# Patient Record
Sex: Male | Born: 1970 | Race: White | Hispanic: No | Marital: Married | Smoking: Never smoker
Health system: Southern US, Community
[De-identification: ages and names within clinical notes are randomized; demographics above are authoritative.]

## PROBLEM LIST (undated history)

## (undated) DIAGNOSIS — F419 Anxiety disorder, unspecified: Secondary | ICD-10-CM

## (undated) DIAGNOSIS — K219 Gastro-esophageal reflux disease without esophagitis: Secondary | ICD-10-CM

## (undated) HISTORY — PX: CLEFT PALATE REPAIR: SUR1165

---

## 1995-04-12 HISTORY — PX: CHOLESTEATOMA EXCISION: SHX1345

## 1998-04-11 HISTORY — PX: MENISCUS REPAIR: SHX5179

## 2008-01-22 ENCOUNTER — Ambulatory Visit (HOSPITAL_BASED_OUTPATIENT_CLINIC_OR_DEPARTMENT_OTHER): Admission: RE | Admit: 2008-01-22 | Discharge: 2008-01-22 | Payer: Self-pay | Admitting: Orthopaedic Surgery

## 2008-11-07 ENCOUNTER — Emergency Department (HOSPITAL_COMMUNITY): Admission: EM | Admit: 2008-11-07 | Discharge: 2008-11-07 | Payer: Self-pay | Admitting: Emergency Medicine

## 2009-04-13 ENCOUNTER — Emergency Department (HOSPITAL_COMMUNITY): Admission: EM | Admit: 2009-04-13 | Discharge: 2009-04-14 | Payer: Self-pay | Admitting: Emergency Medicine

## 2009-09-18 ENCOUNTER — Encounter: Admission: RE | Admit: 2009-09-18 | Discharge: 2009-09-18 | Payer: Self-pay | Admitting: Internal Medicine

## 2009-09-29 ENCOUNTER — Encounter: Admission: RE | Admit: 2009-09-29 | Discharge: 2009-09-29 | Payer: Self-pay | Admitting: Internal Medicine

## 2010-06-27 LAB — BASIC METABOLIC PANEL
CO2: 26 mEq/L (ref 19–32)
Calcium: 9.7 mg/dL (ref 8.4–10.5)
Creatinine, Ser: 0.91 mg/dL (ref 0.4–1.5)
GFR calc Af Amer: >60 mL/min (ref >60)
GFR calc non Af Amer: >60 mL/min (ref >60)
Potassium: 3.6 mEq/L (ref 3.5–5.1)

## 2010-06-27 LAB — DIFFERENTIAL
Basophils Relative: 1 % (ref 0–1)
Eosinophils Absolute: 0.1 10*3/uL (ref 0.0–0.7)
Eosinophils Relative: 1 % (ref 0–5)
Lymphs Abs: 2.9 10*3/uL (ref 0.7–4.0)
Monocytes Absolute: 0.5 10*3/uL (ref 0.1–1.0)
Neutro Abs: 4 10*3/uL (ref 1.7–7.7)

## 2010-06-27 LAB — POCT CARDIAC MARKERS
CKMB, poc: 1.5 ng/mL (ref 1.0–8.0)
CKMB, poc: 2.6 ng/mL (ref 1.0–8.0)
Myoglobin, poc: 76.5 ng/mL (ref 12–200)

## 2010-06-27 LAB — CBC
Platelets: 221 10*3/uL (ref 150–400)
RBC: 5.46 MIL/uL (ref 4.22–5.81)
WBC: 7.5 10*3/uL (ref 4.0–10.5)

## 2010-07-18 LAB — DIFFERENTIAL
Eosinophils Relative: 3 % (ref 0–5)
Lymphocytes Relative: 33 % (ref 12–46)
Lymphs Abs: 2.7 10*3/uL (ref 0.7–4.0)
Monocytes Relative: 7 % (ref 3–12)
Neutrophils Relative %: 56 % (ref 43–77)

## 2010-07-18 LAB — URINALYSIS, ROUTINE W REFLEX MICROSCOPIC
Bilirubin Urine: NEGATIVE
Nitrite: NEGATIVE
Specific Gravity, Urine: 1.027 (ref 1.005–1.030)
Urobilinogen, UA: 0.2 mg/dL (ref 0.0–1.0)
pH: 6 (ref 5.0–8.0)

## 2010-07-18 LAB — CBC
HCT: 45.2 % (ref 39.0–52.0)
Hemoglobin: 15.9 g/dL (ref 13.0–17.0)
MCHC: 35 g/dL (ref 30.0–36.0)
MCV: 86.2 fL (ref 78.0–100.0)
WBC: 8.1 10*3/uL (ref 4.0–10.5)

## 2010-07-18 LAB — BASIC METABOLIC PANEL
BUN: 22 mg/dL (ref 6–23)
CO2: 20 mEq/L (ref 19–32)
Creatinine, Ser: 1.09 mg/dL (ref 0.4–1.5)
GFR calc Af Amer: >60 mL/min (ref >60)
Glucose, Bld: 138 mg/dL — ABNORMAL HIGH (ref 70–99)
Sodium: 134 mEq/L — ABNORMAL LOW (ref 135–145)

## 2010-07-18 LAB — URINE MICROSCOPIC-ADD ON

## 2010-08-24 NOTE — Op Note (Signed)
NAME:  Mark Berg, Mark Berg                 ACCOUNT NO.:  [id number]   MEDICAL RECORD NO.:  [id number]          PATIENT TYPE:  AMB   LOCATION:  DSC                          FACILITY:  [facility]   PHYSICIAN:  Vanita Panda. Magnus Ivan, M.D.DATE OF BIRTH:  [birth year]   DATE OF PROCEDURE:  01/22/2008  DATE OF DISCHARGE:                               OPERATIVE REPORT   PREOPERATIVE DIAGNOSIS:  Right knee medial meniscal tear.   POSTOPERATIVE DIAGNOSIS:  Right knee medial meniscal tear.   PROCEDURE:  1. Right knee arthroscopy with debridement.  2. Right knee arthroscopy with partial medial meniscectomy.   SURGEON:  Vanita Panda. Magnus Ivan, MD   ANESTHESIA:  1. Right local knee block.  2. General anesthesia via LMA.   TOURNIQUET TIME:  None.   BLOOD LOSS:  Minimal.   COMPLICATIONS:  None.   INDICATIONS:  Briefly, Mr. Favia is a 40 year old gentleman who has  been having pain in his right knee for several months now.  He is an  avid runner.  He would like to get more active.  The knee has kept  bothering him on the medial side, so an MRI was obtained and showed a  complex tear involving the mid body to the posterior horn of the medial  meniscus.  I recommended he undergo arthroscopy with a partial medial  meniscectomy.  The risks and benefits of the surgery were explained to  him in length and he agreed to proceed with the surgery.   PROCEDURE IN DETAIL:  After informed consent was obtained and the  appropriate right knee was marked, anesthesia was obtained in local knee  block.  He was brought to the operating room, placed supine on the  operating table.  Nonsterile tourniquet was placed on his upper right  thigh, was never utilized during the case.  A lateral leg post was  utilized and the right leg was prepped and draped with DuraPrep and  sterile drapes including sterile stockinette.  Time-out was called to  identify the correct patient and correct right knee.  With the bed  raised and  leg flexed off side table, I made an anterolateral portal  just lateral to the patellar tendon level inferior pole patella and the  arthroscope was inserted right to the medial side and we could see there  was a complex tear of the medial meniscus.  The ACL was intact and the  intercondylar area as well as the PCL and with the knee in the figure-of-  four position, the lateral compartment was intact.  I then attempted to  make a medial portal site.  The patient did seem to be reacting to pain,  so anesthesia placed LMA for general anesthesia.  I then proceeded with  the remaining portion of the case.  Anterior medial portal was then made  and proceeded to carry out a partial medial meniscectomy.  Using up  cutting biters an arthroscopic shaver, I was able to perform partial  medial mass meniscectomy  leaving some remaining meniscal tissue in the  posterior aspect.  The cartilage was probed on the femoral  condyle and  the plateau.  It was stable in the remaining cartilage and structures  within the knee were also stable.  I then removed the arthroscope and  the instrumentation, removed the effusion from the knee.  I injected a  mixture of Marcaine and morphine into the knee as well as the portal  sites and closed the portal sites with interrupted more 4-0 nylon.  Xeroform followed up with sterile dressing was applied.  The patient was  awakened, extubated, and taken to the recovery room in stable condition.  Postoperatively, I will let him weightbear as tolerated.  He is going to  be at work for the next week and to follow up in the office.      Vanita Panda. Magnus Ivan, M.D.  Electronically Signed     CYB/MEDQ  D:  01/22/2008  T:  01/23/2008  Job:  [id number]

## 2011-01-02 IMAGING — CT CT ABD-PELV W/ CM
2 of 4 series · 10 of 36 positions shown, 17 images · IV contrast (READICAT/WATER & [ID] OMNI 300)
Comparison: Ultrasound of 09/18/2009 and CT abdomen pelvis of
11/07/2008

CLINICAL DATA: Bilateral inguinal lymph nodes noted on ultrasound

CT ABDOMEN AND PELVIS WITH CONTRAST
TECHNIQUE: Multidetector CT imaging of the abdomen and pelvis was
performed following the standard protocol during bolus
administration of intravenous contrast.
Contrast: 100 ml [id number]

[Series 3: routine abdomen · axial · 0.74mm/px · z∈[-357,+3]mm · 9 of 91 slices shown, 15 images]
[im 10/91  soft-tissue]
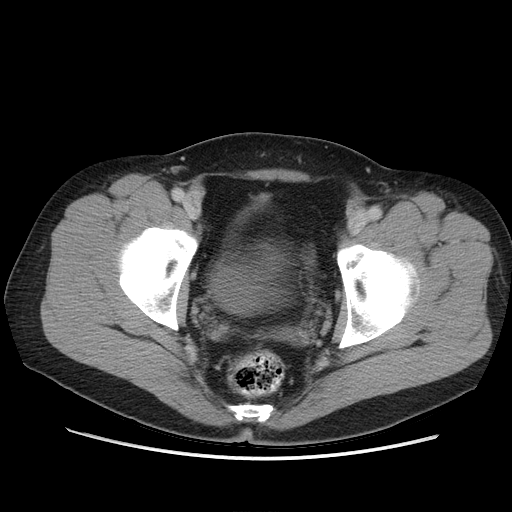
[im 10/91  bone]
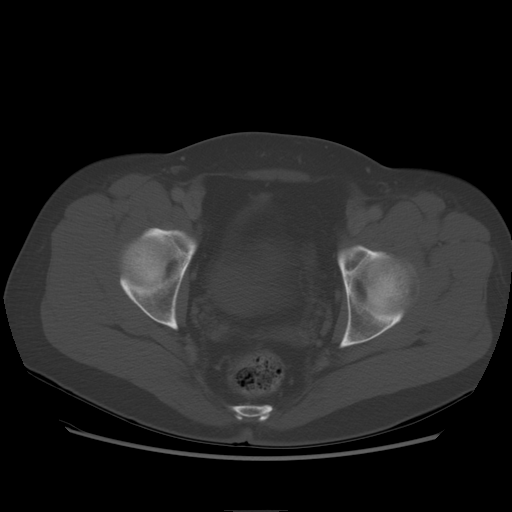
[im 19/91  soft-tissue]
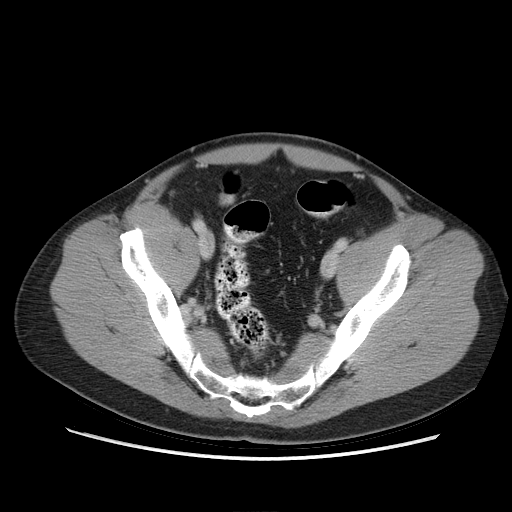
[im 28/91  soft-tissue]
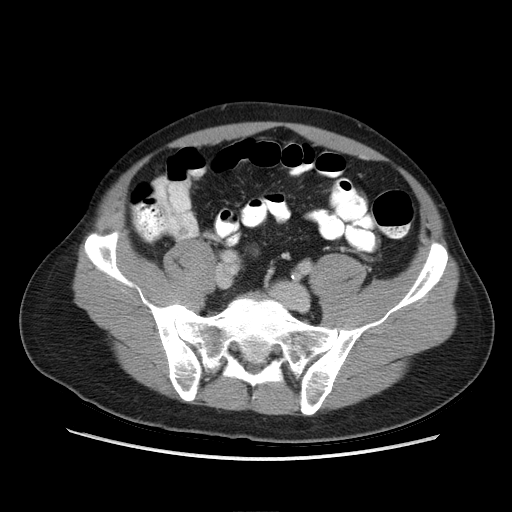
[im 37/91  soft-tissue]
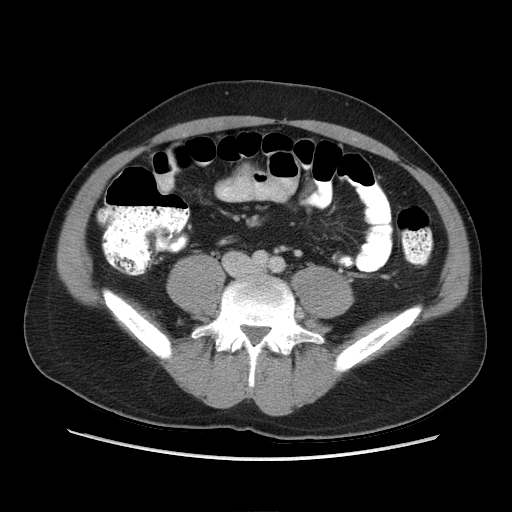
[im 46/91  soft-tissue]
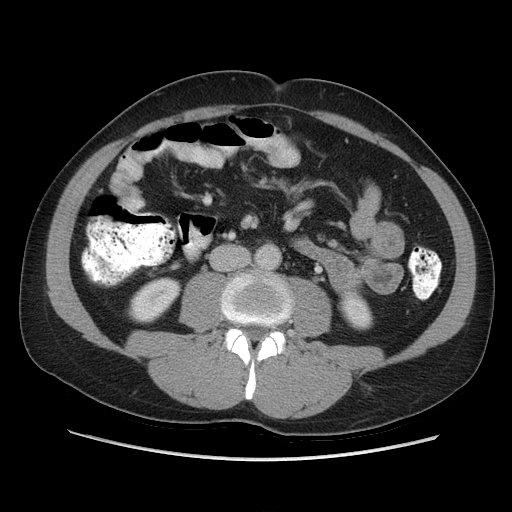
[im 55/91  soft-tissue]
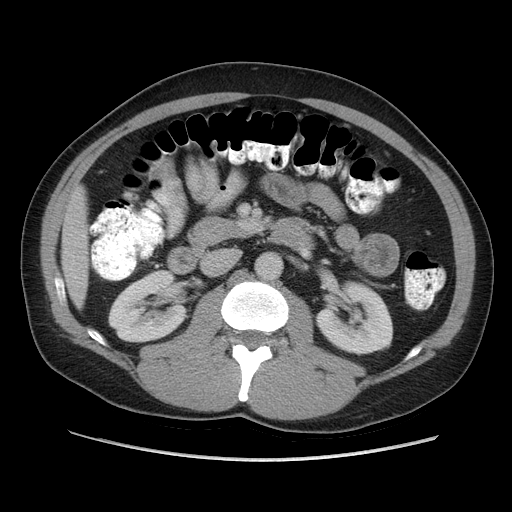
[im 55/91  lung]
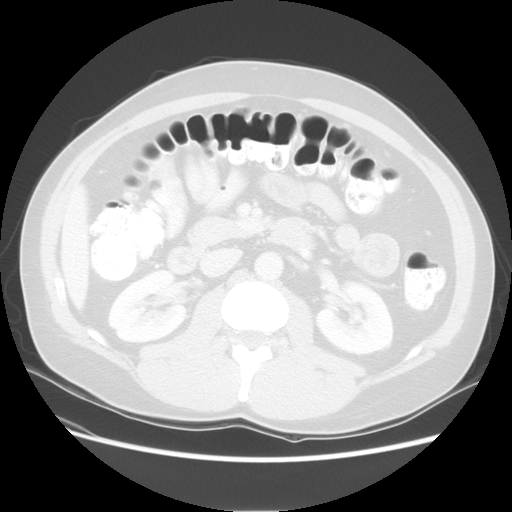
[im 64/91  soft-tissue]
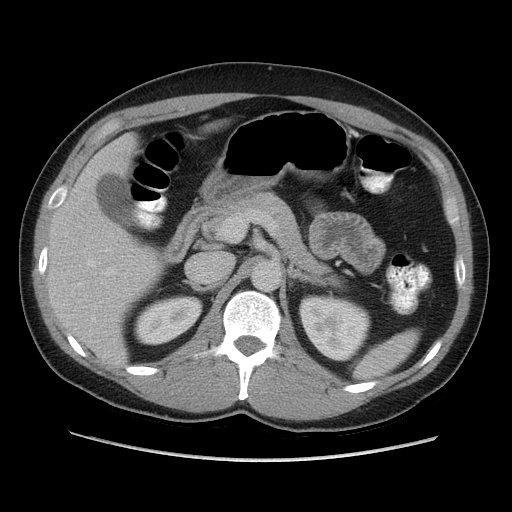
[im 64/91  lung]
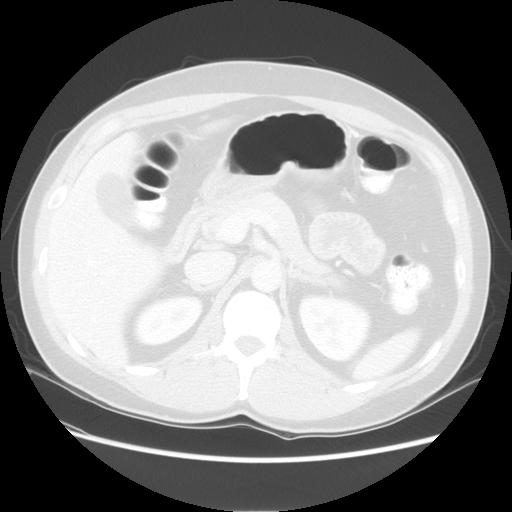
[im 73/91  soft-tissue]
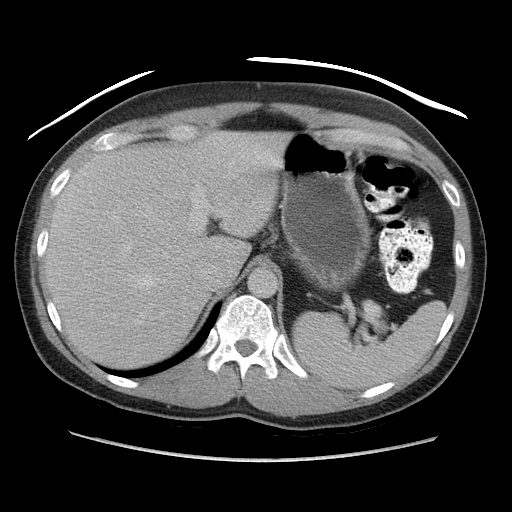
[im 73/91  lung]
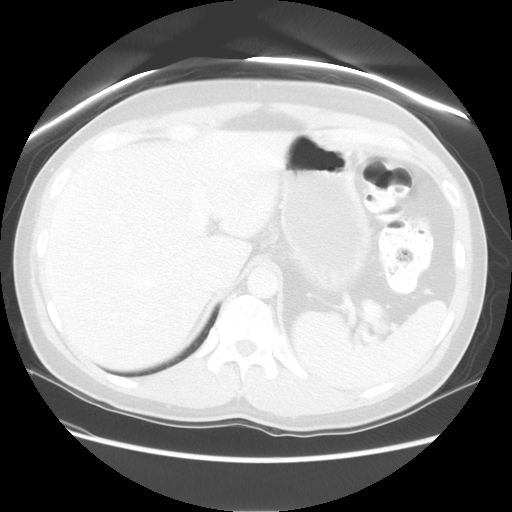
[im 82/91  soft-tissue]
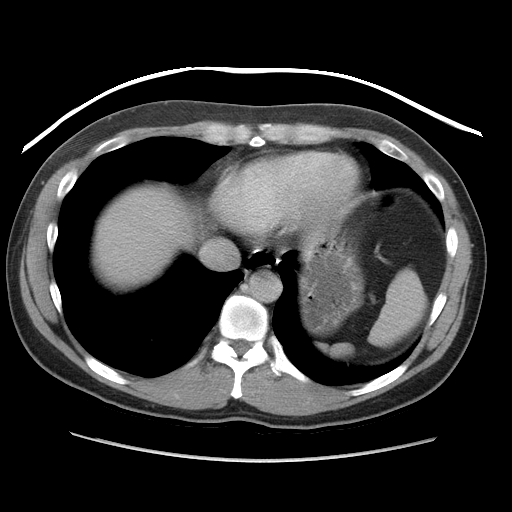
[im 82/91  lung]
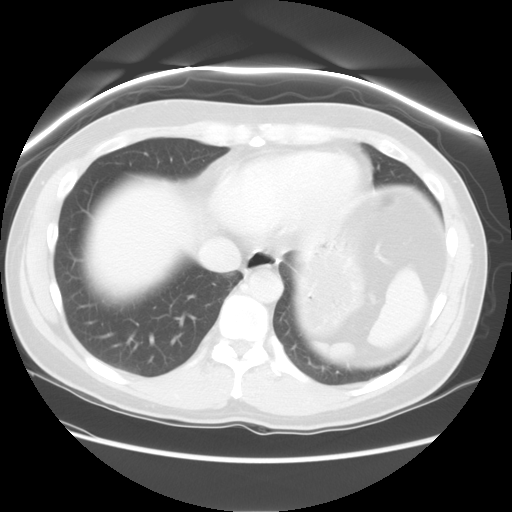
[im 82/91  bone]
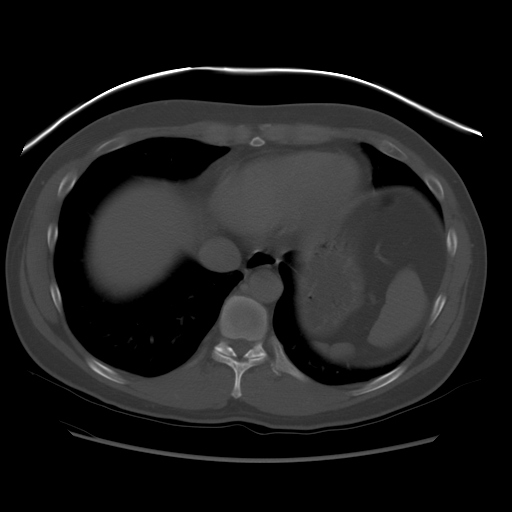

[Series 601: coronal body · coronal · 1.03mm/px · 1 of 118 slices shown, 2 images]
[im 40/118  soft-tissue]
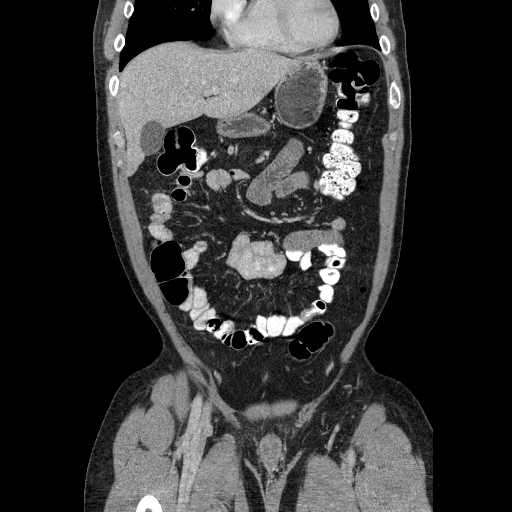
[im 40/118  bone]
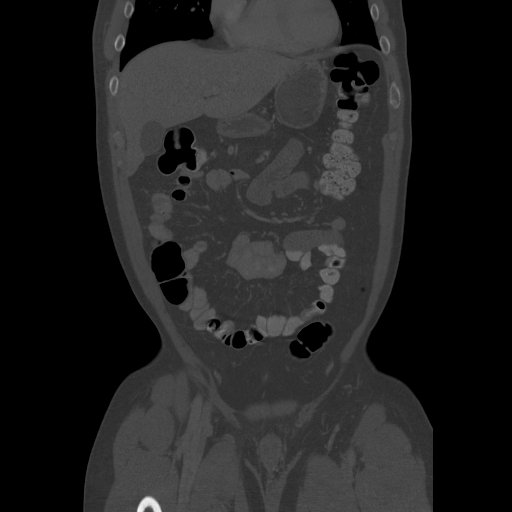

[10 of 36 positions shown; findings below may reference images not displayed]

FINDINGS: The lung bases are clear.  The liver enhances with no
focal abnormality and no ductal dilatation is seen.  No calcified
gallstones are noted.  The pancreas is normal in size and the
pancreatic duct is not dilated.  The adrenal glands and spleen are
unremarkable.  The stomach is moderately fluid distended and
unremarkable.  No renal calculi are seen and there is no evidence
of hydronephrosis or renal mass.  The abdominal aorta is normal in
caliber.  No adenopathy is noted.

The terminal ileum and the appendix are well seen and appear
normal.  The urinary bladder is unremarkable.  The prostate is
normal in size.

The prominent inguinal lymph nodes noted on ultrasound appear fatty
replaced by CT.  No definite adenopathy is seen.  No adenopathy is
noted within the pelvis and no free fluid is seen. No bony
abnormality is seen.
IMPRESSION: 1.  The prominent inguinal lymph nodes noted by recent ultrasound
appear fatty replaced by CT.  No adenopathy is seen.
2.  The terminal ileum and appendix appear normal.
3.  No renal calculi.

## 2012-12-28 ENCOUNTER — Other Ambulatory Visit: Payer: Self-pay | Admitting: Gastroenterology

## 2012-12-28 DIAGNOSIS — R109 Unspecified abdominal pain: Secondary | ICD-10-CM

## 2013-01-02 ENCOUNTER — Ambulatory Visit
Admission: RE | Admit: 2013-01-02 | Discharge: 2013-01-02 | Disposition: A | Discharge status: Home / Self Care | Payer: Managed Care, Other (non HMO) | Source: Ambulatory Visit | Attending: Gastroenterology | Admitting: Gastroenterology

## 2013-01-02 DIAGNOSIS — R109 Unspecified abdominal pain: Secondary | ICD-10-CM

## 2013-01-08 ENCOUNTER — Ambulatory Visit
Admission: RE | Admit: 2013-01-08 | Discharge: 2013-01-08 | Disposition: A | Discharge status: Home / Self Care | Payer: Managed Care, Other (non HMO) | Source: Ambulatory Visit | Attending: Gastroenterology | Admitting: Gastroenterology

## 2014-04-13 IMAGING — US US ABDOMEN LIMITED
1 series · 14 of 25 positions shown · non-contrast
Comparison: CT 09/29/2009.

CLINICAL DATA: History of right upper quadrant abdominal pain

EXAM:
US ABDOMEN LIMITED - RIGHT UPPER QUADRANT

[Series 1: us abdomen limited · 0.30mm/px · 14 of 45 slices shown]
[im 1/45]
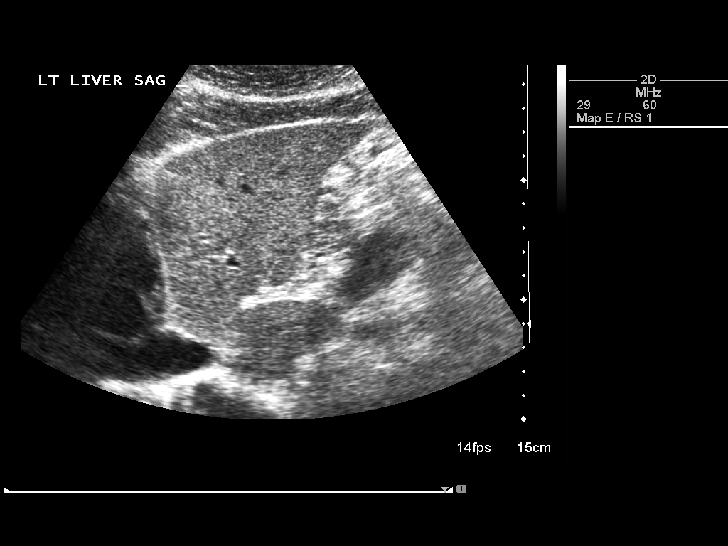
[im 4/45]
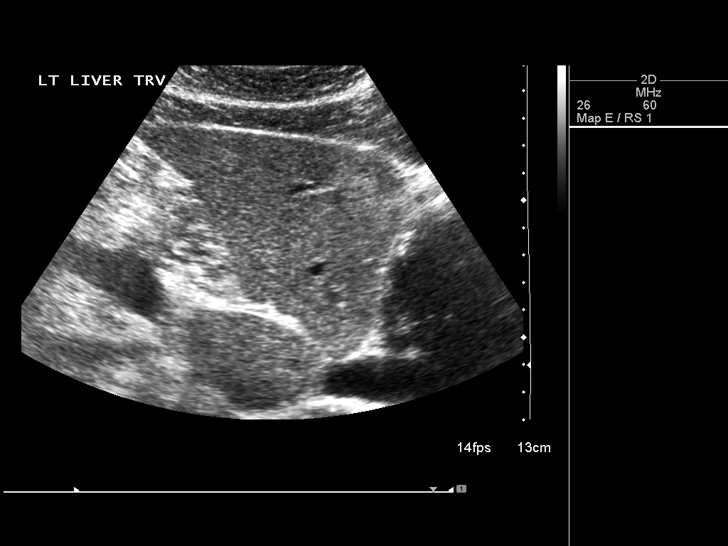
[im 8/45]
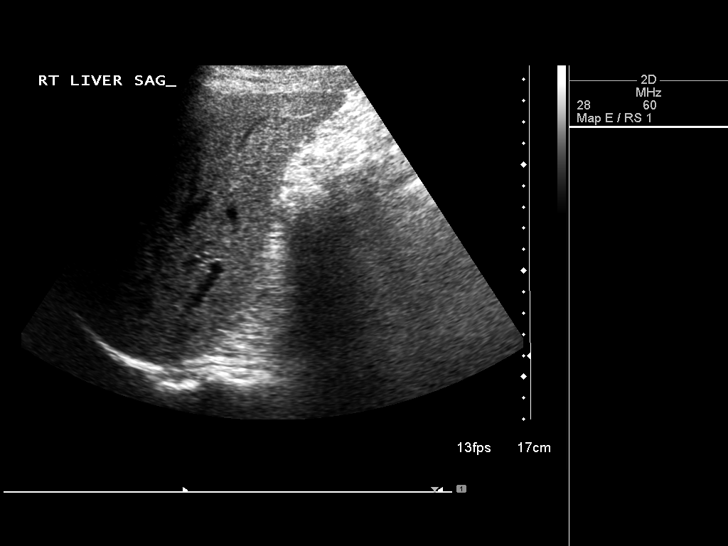
[im 12/45]
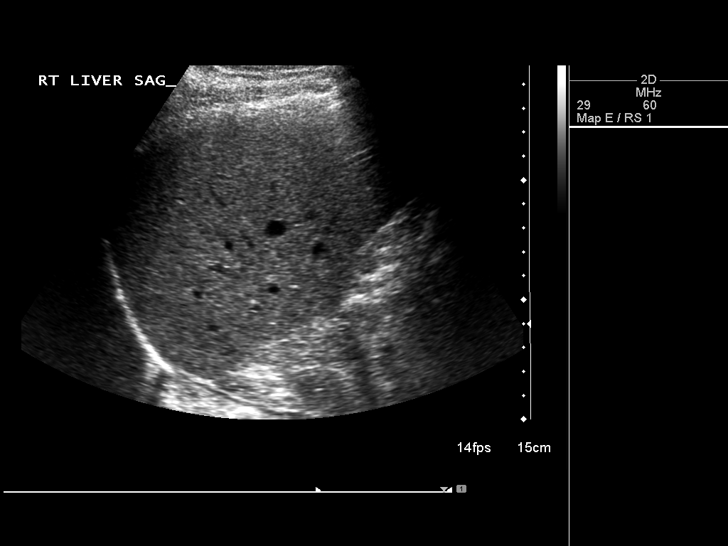
[im 15/45]
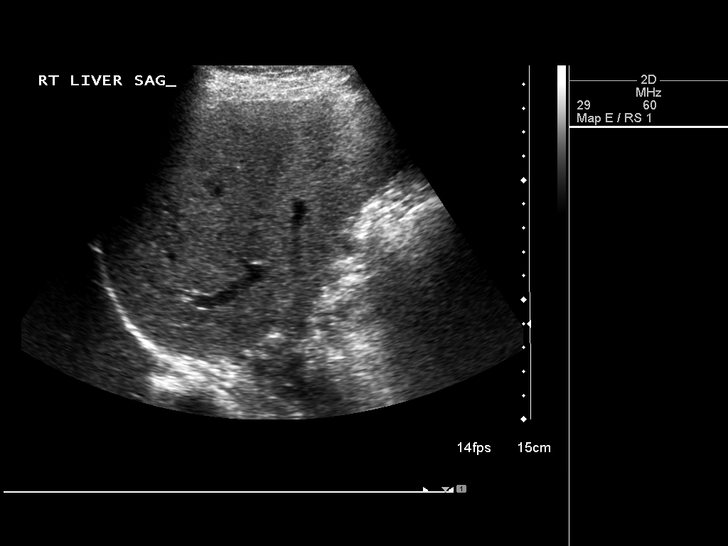
[im 17/45]
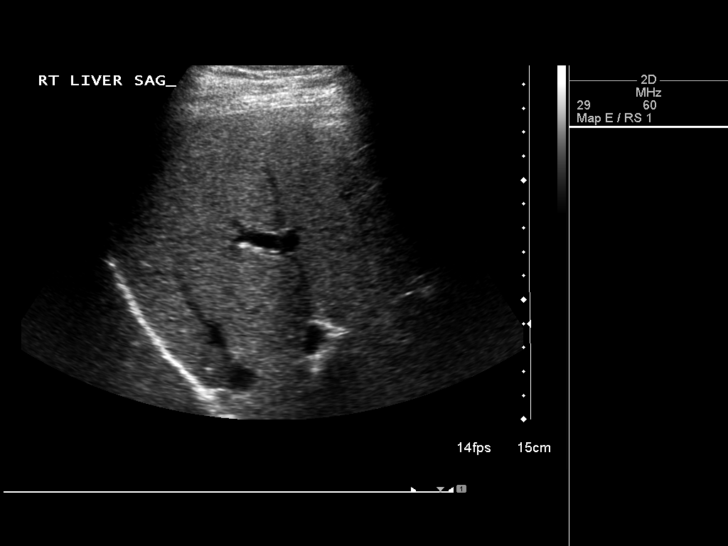
[im 21/45]
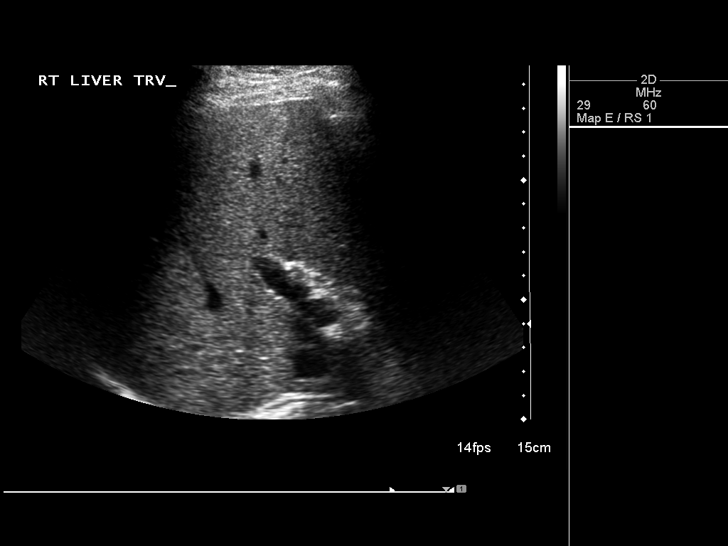
[im 24/45]
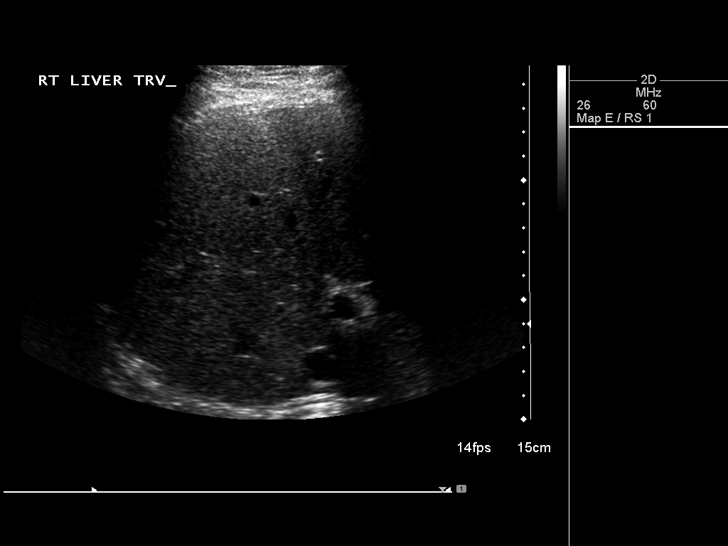
[im 28/45]
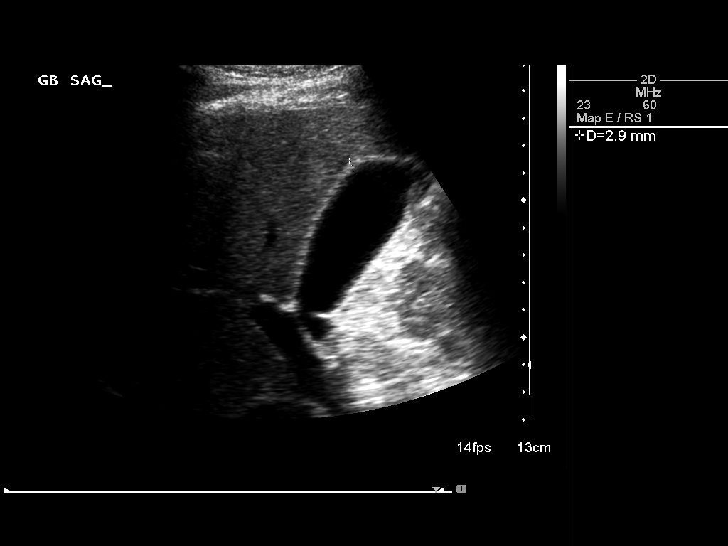
[im 30/45]
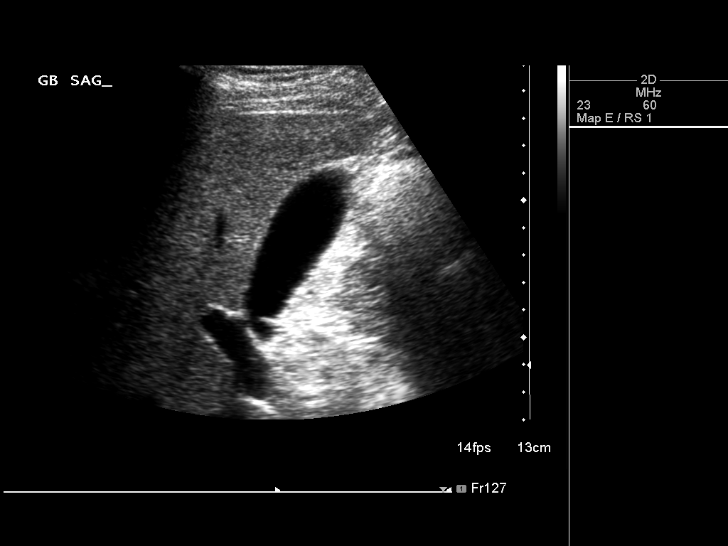
[im 34/45]
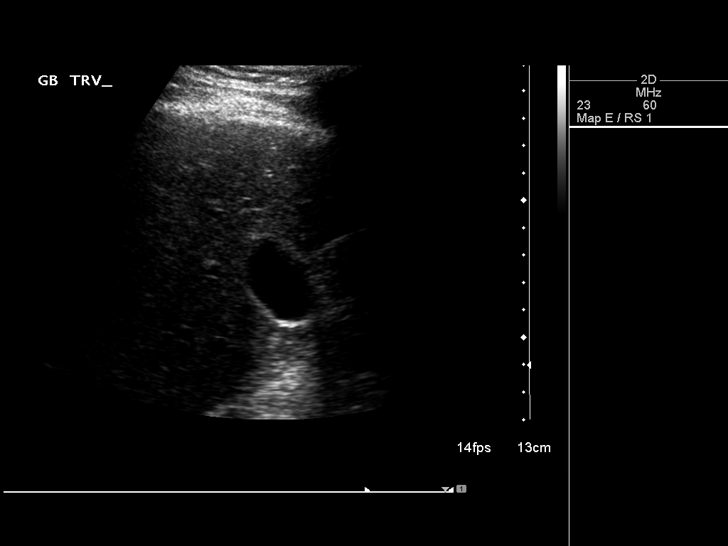
[im 37/45]
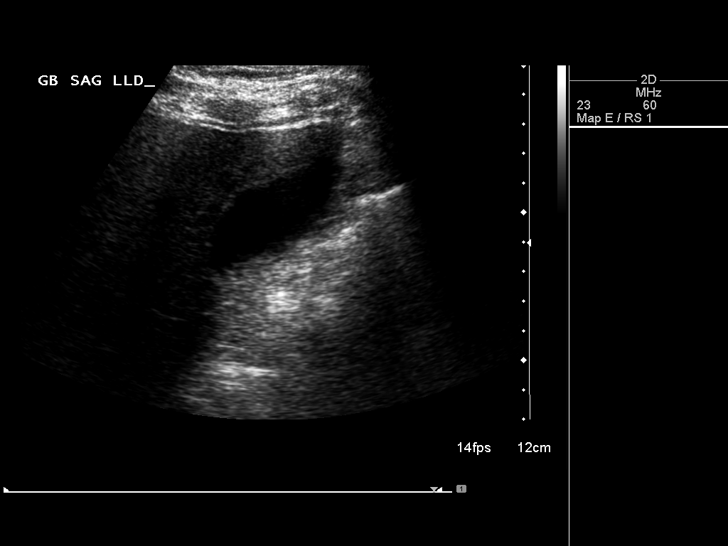
[im 41/45]
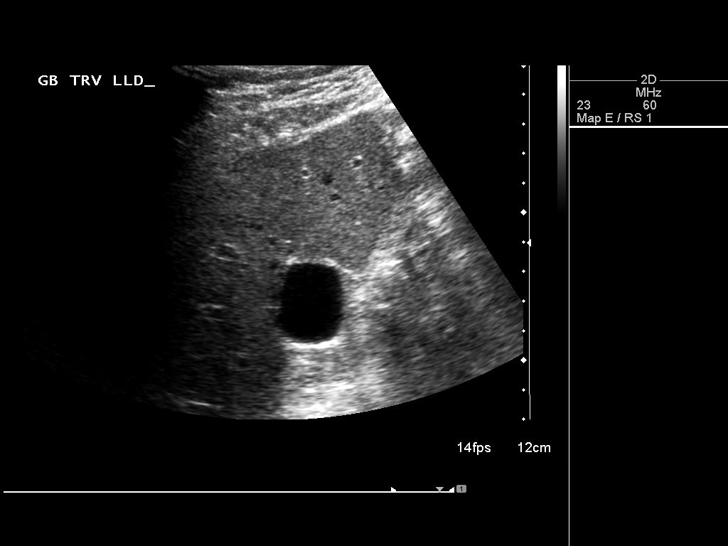
[im 45/45]
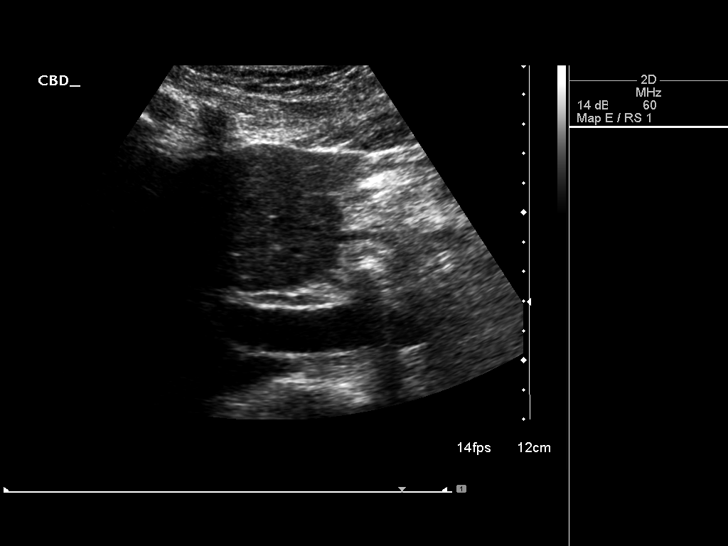

[14 of 25 positions shown; findings below may reference images not displayed]

FINDINGS: Gallbladder:

No gallstones, gallbladder wall thickening, or pericholecystic
fluid. No positive ultrasonic Murphy's sign. Gallbladder wall
thickness measured 2.9 mm.

Common bile duct

Diameter: Common bile duct measures 3.9 mm. No choledocholithiasis
is evident.

Liver:

No focal lesion identified. Within normal limits in parenchymal
echogenicity.
IMPRESSION: No abnormality is identified.

## 2016-01-13 ENCOUNTER — Ambulatory Visit (INDEPENDENT_AMBULATORY_CARE_PROVIDER_SITE_OTHER): Payer: Self-pay | Admitting: Physician Assistant

## 2016-01-13 DIAGNOSIS — S92515A Nondisplaced fracture of proximal phalanx of left lesser toe(s), initial encounter for closed fracture: Secondary | ICD-10-CM

## 2016-02-08 ENCOUNTER — Ambulatory Visit (INDEPENDENT_AMBULATORY_CARE_PROVIDER_SITE_OTHER): Payer: Self-pay | Admitting: Orthopaedic Surgery

## 2016-02-08 ENCOUNTER — Encounter (INDEPENDENT_AMBULATORY_CARE_PROVIDER_SITE_OTHER): Payer: Self-pay | Admitting: Orthopaedic Surgery

## 2016-02-08 ENCOUNTER — Ambulatory Visit (INDEPENDENT_AMBULATORY_CARE_PROVIDER_SITE_OTHER): Payer: Self-pay

## 2016-02-08 DIAGNOSIS — S92515D Nondisplaced fracture of proximal phalanx of left lesser toe(s), subsequent encounter for fracture with routine healing: Secondary | ICD-10-CM

## 2016-02-08 NOTE — Progress Notes (Signed)
Mr. Mark Berg is 3-4 weeks into a stable right foot fifth toe proximal phalanx fracture. His pain is minimal and he has no issues. We did x-ray his foot today and shows interval healing of the fracture with no, getting features at all. His foot exam shows minimal pain. At this point he is back to regular shoes and he can increase his activities as comfort allows.  He will follow-up as needed.

## 2016-11-10 ENCOUNTER — Telehealth: Payer: Self-pay

## 2016-11-10 NOTE — Telephone Encounter (Signed)
SENT NOTE ON SCHEDULING

## 2017-01-24 ENCOUNTER — Ambulatory Visit (INDEPENDENT_AMBULATORY_CARE_PROVIDER_SITE_OTHER): Payer: Self-pay | Admitting: Orthopaedic Surgery

## 2017-01-24 ENCOUNTER — Encounter (INDEPENDENT_AMBULATORY_CARE_PROVIDER_SITE_OTHER): Payer: Self-pay | Admitting: Orthopaedic Surgery

## 2017-01-24 ENCOUNTER — Ambulatory Visit (INDEPENDENT_AMBULATORY_CARE_PROVIDER_SITE_OTHER): Payer: Self-pay

## 2017-01-24 DIAGNOSIS — M79672 Pain in left foot: Secondary | ICD-10-CM

## 2017-01-24 NOTE — Progress Notes (Signed)
   Office Visit Note   Patient: Mark Berg           Date of Birth: 1971/04/04           MRN: [id number] Visit Date: 01/24/2017              Requested by: [facility] [address] [place], [place] [address] PCP: [facility]   Assessment & Plan: Visit Diagnoses:  1. Pain in left foot     Plan: Impression is left ankle sprain and foot contusion.  Recommend symptomatic treatment with ice, rest, nsaids as needed.  Follow up prn.  Follow-Up Instructions: Return if symptoms worsen or fail to improve.   Orders:  Orders Placed This Encounter  Procedures  . XR Foot Complete Left   No orders of the defined types were placed in this encounter.     Procedures: No procedures performed   Clinical Data: No additional findings.   Subjective: Chief Complaint  Patient presents with  . 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 He had pain later last night but today feels much better. He wants to get it checked out. Denies any numbness and tingling.    Review of Systems  Constitutional: Negative.   All other systems reviewed and are negative.    Objective: Vital Signs: There were no vitals taken for this visit.  Physical Exam  Constitutional: He is oriented to person, place, and time. He appears well-developed and well-nourished.  Pulmonary/Chest: Effort normal.  Abdominal: Soft.  Neurological: He is alert and oriented to person, place, and time.  Skin: Skin is warm.  Psychiatric: He has a normal mood and affect. His behavior is normal. Judgment and thought content normal.  Nursing note and vitals reviewed.   Ortho Exam Left foot exam shows mild tenderness along  The fifth ray. There is no bruising or swelling.  Overall exam  is benign. Specialty Comments:  No specialty comments available.  Imaging: Xr Foot Complete Left  Result Date: 01/24/2017 No acute bony abnormalities    PMFS History: There are no active problems to display for this patient.  No past medical history on file.  No family history on file.  No past surgical history on file. Social History   Occupational History  . Not on file.   Social History Main Topics  . Smoking status: Never Smoker  . Smokeless tobacco: Never Used  . Alcohol use Not on file  . Drug use: Unknown  . Sexual activity: Not on file

## 2018-07-27 ENCOUNTER — Encounter (INDEPENDENT_AMBULATORY_CARE_PROVIDER_SITE_OTHER): Payer: Self-pay | Admitting: Orthopaedic Surgery

## 2018-07-27 ENCOUNTER — Ambulatory Visit (INDEPENDENT_AMBULATORY_CARE_PROVIDER_SITE_OTHER): Payer: Self-pay

## 2018-07-27 ENCOUNTER — Ambulatory Visit (INDEPENDENT_AMBULATORY_CARE_PROVIDER_SITE_OTHER): Payer: Self-pay | Admitting: Orthopaedic Surgery

## 2018-07-27 ENCOUNTER — Other Ambulatory Visit: Payer: Self-pay

## 2018-07-27 DIAGNOSIS — M25562 Pain in left knee: Secondary | ICD-10-CM

## 2018-07-27 NOTE — Progress Notes (Signed)
   Office Visit Note   Patient: Mark Berg           Date of Birth: Oct 11, 1970           MRN: [id number] Visit Date: 07/27/2018              Requested by: [facility] [address] [place], [place] [address] PCP: [facility]   Assessment & Plan: Visit Diagnoses:  1. Acute pain of left knee     Plan: Impression is questionable left knee medial meniscus tear.  Patient does not feel as though this is painful enough to proceed with cortisone injection today.  He will ice and elevate and take it easy over the next few days.  He will follow-up with [place] should his pain worsen.  Call with concerns or questions in the meantime.  Follow-Up Instructions: Return if symptoms worsen or fail to improve.   Orders:  Orders Placed This Encounter  Procedures  . XR KNEE 3 VIEW LEFT   No orders of the defined types were placed in this encounter.     Procedures: No procedures performed   Clinical Data: No additional findings.   Subjective: Chief Complaint  Patient presents with  . Left Knee - Pain    HPI patient is a pleasant 48 year old gentleman who presents our clinic today with concerns about his left knee.  He noticed pain to the left knee following yardwork this past weekend.  No specific injury.  The pain he has had is to the medial aspect of his knee.  Worse with flexion of the knee.  He has noticed intermittent swelling which has subsided over the past day.  He denies any locking or catching.  No instability.  He has been taking Advil as needed.  Overall, he has noticed improvement.  Review of Systems as detailed in HPI.  All others reviewed and are negative.   Objective: Vital Signs: There were no vitals taken for this visit.  Physical Exam well-developed and well-nourished gentleman in no acute distress.  Alert and oriented x3.  Ortho Exam examination of his left knee shows no effusion.  Range of motion 0 to  125 degrees.  He does have moderate tenderness to the medial meniscus with a mildly positive McMurray.  Patient's and collaterals are stable.  No patellofemoral crepitus.  He is neurovascularly intact distally.  Specialty Comments:  No specialty comments available.  Imaging: Xr Knee 3 View Left  Result Date: 07/27/2018 No acute or structural abnormalities    PMFS History: Patient Active Problem List   Diagnosis Date Noted  . Acute pain of left knee 07/27/2018   History reviewed. No pertinent past medical history.  History reviewed. No pertinent family history.  History reviewed. No pertinent surgical history. Social History   Occupational History  . Not on file  Tobacco Use  . Smoking status: Never Smoker  . Smokeless tobacco: Never Used  Substance and Sexual Activity  . Alcohol use: Not on file  . Drug use: Not on file  . Sexual activity: Not on file

## 2019-09-23 ENCOUNTER — Ambulatory Visit: Payer: Self-pay | Admitting: Surgery

## 2019-09-23 NOTE — H&P (Signed)
Katheren Shams Appointment: 09/23/2019 9:40 AM [facility] Patient #: [id number] DOB: 1971-03-04 Married / Language: [place] / Race: White Male  History of Present Illness Maisie Fus A. Bralen Wiltgen MD; 09/23/2019 2:29 PM) Patient words: Patient sent at the request of Meridee Score PA for lipoma over his right upper back. He has been present for many months. It is stable in size. Initially this was felt to be a cyst and an attempt at incision and drainage was unsuccessful. The patient has a desire to have this removed. There is no redness, drainage or change in skin. This was stable in size. He has been present for many months but the patient's unsure exactly how long.  The patient is a 49 year old male.   Past Surgical History (Chanel Lonni Fix, CMA; 09/23/2019 9:54 AM) Knee Surgery Right. Oral Surgery  Allergies (Chanel Lonni Fix, CMA; 09/23/2019 9:55 AM) No Known Drug Allergies [09/23/2019]: Allergies Reconciled  Medication History (Chanel Lonni Fix, CMA; 09/23/2019 9:55 AM) No Current Medications Medications Reconciled  Social History Micheal Likens, CMA; 09/23/2019 9:54 AM) Alcohol use Occasional alcohol use. Caffeine use Coffee. No drug use Tobacco use Never smoker.  Family History Micheal Likens, [place]; 09/23/2019 9:54 AM) First Degree Relatives No pertinent family history  Other Problems Micheal Likens, CMA; 09/23/2019 9:54 AM) Anxiety Disorder Gastroesophageal Reflux Disease Kidney Stone     Review of Systems (Chanel Lonni Fix CMA; 09/23/2019 9:54 AM) General Not Present- Appetite Loss, Chills, Fatigue, Fever, Night Sweats, Weight Gain and Weight Loss. Skin Not Present- Change in Wart/Mole, Dryness, Hives, Jaundice, New Lesions, Non-Healing Wounds, Rash and Ulcer. HEENT Present- Hearing Loss, Seasonal Allergies and Wears glasses/contact lenses. Not Present- Earache, Hoarseness, Nose Bleed, Oral Ulcers, Ringing in the Ears, Sinus Pain, Sore Throat, Visual  Disturbances and Yellow Eyes. Respiratory Not Present- Bloody sputum, Chronic Cough, Difficulty Breathing, Snoring and Wheezing. Breast Not Present- Breast Mass, Breast Pain, Nipple Discharge and Skin Changes. Cardiovascular Not Present- Chest Pain, Difficulty Breathing Lying Down, Leg Cramps, Palpitations, Rapid Heart Rate, Shortness of Breath and Swelling of Extremities. Gastrointestinal Not Present- Abdominal Pain, Bloating, Bloody Stool, Change in Bowel Habits, Chronic diarrhea, Constipation, Difficulty Swallowing, Excessive gas, Gets full quickly at meals, Hemorrhoids, Indigestion, Nausea, Rectal Pain and Vomiting. Male Genitourinary Not Present- Blood in Urine, Change in Urinary Stream, Frequency, Impotence, Nocturia, Painful Urination, Urgency and Urine Leakage. Musculoskeletal Not Present- Back Pain, Joint Pain, Joint Stiffness, Muscle Pain, Muscle Weakness and Swelling of Extremities. Neurological Not Present- Decreased Memory, Fainting, Headaches, Numbness, Seizures, Tingling, Tremor, Trouble walking and Weakness. Psychiatric Not Present- Anxiety, Bipolar, Change in Sleep Pattern, Depression, Fearful and Frequent crying. Endocrine Not Present- Cold Intolerance, Excessive Hunger, Hair Changes, Heat Intolerance, Hot flashes and New Diabetes. Hematology Not Present- Blood Thinners, Easy Bruising, Excessive bleeding, Gland problems, HIV and Persistent Infections.  Vitals (Chanel Nolan CMA; 09/23/2019 9:55 AM) 09/23/2019 9:55 AM Weight: 195.38 lb Height: 72in Body Surface Area: 2.11 m Body Mass Index: 26.5 kg/m  Temp.: 97.19F  Pulse: 81 (Regular)  BP: 130/74(Sitting, Left Arm, Standard)        Physical Exam (Vasily Fedewa A. Navjot Pilgrim MD; 09/23/2019 2:29 PM)  Integumentary Note: The right upper scapular region overlying the trapezius muscle was a 3 cm x 5 cm subcutaneous mobile well demarcated mass consistent with lipoma  Chest and Lung Exam Chest and lung exam reveals  -quiet, even and easy respiratory effort with no use of accessory muscles and on auscultation, normal breath sounds, no adventitious sounds and normal vocal resonance. Inspection Chest Wall -  Normal. Back - normal.  Cardiovascular Cardiovascular examination reveals -on palpation PMI is normal in location and amplitude, no palpable S3 or S4. Normal cardiac borders., normal heart sounds, regular rate and rhythm with no murmurs, carotid auscultation reveals no bruits and normal pedal pulses bilaterally.  Neurologic Neurologic evaluation reveals -alert and oriented x 3 with no impairment of recent or remote memory. Mental Status-Normal.  Neuropsychiatric The patient's mood and affect are described as -normal. Associations-intact.  Musculoskeletal Normal Exam - Left-Upper Extremity Strength Normal and Lower Extremity Strength Normal. Normal Exam - Right-Upper Extremity Strength Normal, Lower Extremity Weakness.    Assessment & Plan (Eulogia Dismore A. Sieanna Vanstone MD; 09/23/2019 2:30 PM)  LIPOMA OF BACK (D17.1) Impression: Patient desires surgical excision of right upper back lipoma. Risks, benefits and observational discussed with him today. Risk of bleeding, infection, wound complication, injury to nerve structures another upper procedures discussed. Anesthesia was discussed. Other risks which are much less likely include cardiovascular event, deep vein thrombosis, and injury to nerve, blood vessel and a retractor.  Current Plans The pathophysiology of skin & subcutaneous masses was discussed. Natural history risks without surgery were discussed. I recommended surgery to remove the mass. I explained the technique of removal with use of local anesthesia & possible need for more aggressive sedation/anesthesia for patient comfort.  Risks such as bleeding, infection, wound breakdown, heart attack, death, and other risks were discussed. I noted a good likelihood this will help address the  problem. Possibility that this will not correct all symptoms was explained. Possibility of regrowth/recurrence of the mass was discussed. We will work to minimize complications. Questions were answered. The patient expresses understanding & wishes to proceed with surgery.  Pt Education - [facility] Free Text Education/Instructions: discussed with patient and provided information.

## 2019-12-11 ENCOUNTER — Encounter (HOSPITAL_BASED_OUTPATIENT_CLINIC_OR_DEPARTMENT_OTHER): Payer: Self-pay

## 2019-12-11 ENCOUNTER — Emergency Department (HOSPITAL_BASED_OUTPATIENT_CLINIC_OR_DEPARTMENT_OTHER)
Admission: EM | Admit: 2019-12-11 | Discharge: 2019-12-11 | Disposition: A | Discharge status: Home / Self Care | Payer: Self-pay | Attending: Emergency Medicine | Admitting: Emergency Medicine

## 2019-12-11 ENCOUNTER — Emergency Department (HOSPITAL_BASED_OUTPATIENT_CLINIC_OR_DEPARTMENT_OTHER): Payer: Self-pay

## 2019-12-11 ENCOUNTER — Other Ambulatory Visit: Payer: Self-pay

## 2019-12-11 DIAGNOSIS — M25562 Pain in left knee: Secondary | ICD-10-CM | POA: Insufficient documentation

## 2019-12-11 DIAGNOSIS — R0789 Other chest pain: Secondary | ICD-10-CM | POA: Insufficient documentation

## 2019-12-11 HISTORY — DX: Gastro-esophageal reflux disease without esophagitis: K21.9

## 2019-12-11 LAB — BASIC METABOLIC PANEL
Anion gap: 11 (ref 5–15)
BUN: 17 mg/dL (ref 6–20)
CO2: 24 mmol/L (ref 22–32)
Calcium: 9.3 mg/dL (ref 8.9–10.3)
Chloride: 103 mmol/L (ref 98–111)
Creatinine, Ser: 0.88 mg/dL (ref 0.61–1.24)
GFR calc Af Amer: >60 mL/min (ref >60)
GFR calc non Af Amer: >60 mL/min (ref >60)
Glucose, Bld: 105 mg/dL — ABNORMAL HIGH (ref 70–99)
Potassium: 3.6 mmol/L (ref 3.5–5.1)
Sodium: 138 mmol/L (ref 135–145)

## 2019-12-11 LAB — CBC
HCT: 44 % (ref 39.0–52.0)
Hemoglobin: 15.2 g/dL (ref 13.0–17.0)
MCH: 28.6 pg (ref 26.0–34.0)
MCHC: 34.5 g/dL (ref 30.0–36.0)
MCV: 82.9 fL (ref 80.0–100.0)
Platelets: 298 10*3/uL (ref 150–400)
RBC: 5.31 MIL/uL (ref 4.22–5.81)
RDW: 12.1 % (ref 11.5–15.5)
WBC: 8.2 10*3/uL (ref 4.0–10.5)
nRBC: 0 % (ref 0.0–0.2)

## 2019-12-11 LAB — TROPONIN I (HIGH SENSITIVITY)
Troponin I (High Sensitivity): 4 ng/L (ref <18)
Troponin I (High Sensitivity): 3 ng/L (ref <18)

## 2019-12-11 NOTE — Discharge Instructions (Signed)
Your blood work, EKG, and chest xray are very reassuring.  Follow up with your primary care provider regarding your visit today.  Return to the ER for new or worsening symptoms; including worsening chest pain, shortness of breath, pain that radiates to the arm or neck, pain or shortness of breath worsened with exertion.

## 2019-12-11 NOTE — ED Provider Notes (Signed)
[facility] EMERGENCY DEPARTMENT Provider Note   CSN: [id number] Arrival date & time: 12/11/19  1243     History Chief Complaint  Patient presents with  . Chest Pain    Mark Berg is a 49 y.o. male with PMHx GERD, anxiety, presenting to the ED with complaint of sudden onset of left sided chest pain that began yesterday.  Patient states symptoms began immediately after a very stressful event at home.  He initially thought the symptoms are related to his anxiety, however persisted throughout the evening and into the morning.  He describes a left-sided soreness to his chest extending to his upper and lower extremity.  He also describes it as feeling like a gas pain.  He denies associated nausea, diaphoresis, shortness of breath.  Symptoms began improving after checking into the ED.  He states last week he had a viral upper respiratory infection that began on Friday.  His symptoms resolved by Monday.  He got Covid tested on Monday and was negative, however his father was positive for Covid last week, and the patient had been with him for days prior to his symptom onset.  He states his symptoms include fatigue and body aches.  He states his illness also aggravated his GERD.  His symptoms today feel different than GERD.  No known cardiac history.  No history of hypertension or diabetes.  No history of DVT or PE.  The history is provided by the patient.       Past Medical History:  Diagnosis Date  . GERD (gastroesophageal reflux disease)     Patient Active Problem List   Diagnosis Date Noted  . Acute pain of left knee 07/27/2018    Past Surgical History:  Procedure Laterality Date  . CLEFT PALATE REPAIR    . KNEE SURGERY         No family history on file.  Social History   Tobacco Use  . Smoking status: Never Smoker  . Smokeless tobacco: Never Used  Vaping Use  . Vaping Use: Never used  Substance Use Topics  . Alcohol use: Yes    Comment: occ  . Drug use:  Never    Home Medications Prior to Admission medications   Not on File    Allergies    Patient has no known allergies.  Review of Systems   Review of Systems  All other systems reviewed and are negative.   Physical Exam Updated Vital Signs BP (!) 147/97 (BP Location: Left Arm)   Pulse 88   Temp 99 F (37.2 C) (Oral)   Resp 20   Ht 5\' 11"  (1.803 m)   Wt 87.5 kg   SpO2 100%   BMI 26.92 kg/m   Physical Exam Vitals and nursing note reviewed.  Constitutional:      General: He is not in acute distress.    Appearance: He is well-developed. He is not ill-appearing.  HENT:     Head: Normocephalic and atraumatic.  Eyes:     Conjunctiva/sclera: Conjunctivae normal.  Cardiovascular:     Rate and Rhythm: Normal rate and regular rhythm.     Heart sounds: Normal heart sounds.  Pulmonary:     Effort: Pulmonary effort is normal. No respiratory distress.     Breath sounds: Normal breath sounds.  Chest:     Chest wall: No tenderness.  Abdominal:     General: Bowel sounds are normal.     Palpations: Abdomen is soft.     Tenderness:  There is no abdominal tenderness.  Musculoskeletal:     Right lower leg: No edema.     Left lower leg: No edema.  Skin:    General: Skin is warm.  Neurological:     Mental Status: He is alert.  Psychiatric:        Behavior: Behavior normal.     ED Results / Procedures / Treatments   Labs (all labs ordered are listed, but only abnormal results are displayed) Labs Reviewed  BASIC METABOLIC PANEL - Abnormal; Notable for the following components:      Result Value   Glucose, Bld 105 (*)    All other components within normal limits  CBC  TROPONIN I (HIGH SENSITIVITY)  TROPONIN I (HIGH SENSITIVITY)    EKG EKG Interpretation  Date/Time:  Wednesday December 11 2019 13:09:00 EDT Ventricular Rate:  86 PR Interval:  132 QRS Duration: 94 QT Interval:  354 QTC Calculation: 423 R Axis:   -35 Text Interpretation: Normal sinus rhythm  Possible Left atrial enlargement Left axis deviation Abnormal ECG No significant change since prior 1/11 Confirmed by Meridee Score [phone]) on 12/11/2019 3:38:10 PM   Radiology DG Chest 2 View  Result Date: 12/11/2019 CLINICAL DATA:  Chest pain and fluttering. EXAM: CHEST - 2 VIEW COMPARISON:  11/17/2011 FINDINGS: Heart size is normal. Mediastinal shadows are normal. The lungs are clear. No bronchial thickening. No infiltrate, mass, effusion or collapse. Pulmonary vascularity is normal. No bony abnormality. IMPRESSION: Normal chest. Electronically Signed   By: Paulina Fusi M.D.   On: 12/11/2019 13:23    Procedures Procedures (including critical care time)  Medications Ordered in ED Medications - No data to display  ED Course  I have reviewed the triage vital signs and the nursing notes.  Pertinent labs & imaging results that were available during my care of the patient were reviewed by me and considered in my medical decision making (see chart for details).    MDM Rules/Calculators/A&P                          Pt presenting with left sided chest discomfort that began following a stressful event at home last night. Presentation seems most consistent with anxiety given hx of the same, recent stressor prior to onset, and atypical chest pain symptoms. Chest pain is not likely of cardiac or pulmonary etiology d/t presentation, low risk wells, VSS, no tracheal deviation, no JVD or new murmur, RRR, breath sounds equal bilaterally, EKG without acute abnormalities, negative troponin, and negative CXR. Patient is to be discharged with recommendation to follow up with PCP in regards to today's hospital visit. Pt has been advised to return to the ED if CP becomes exertional, associated with diaphoresis or nausea, radiates to left jaw/arm, worsens or becomes concerning in any way. Pt appears reliable for follow up and is agreeable to discharge.   Final Clinical Impression(s) / ED Diagnoses Final  diagnoses:  Chest discomfort    Rx / DC Orders ED Discharge Orders    None       Jodeci Roarty, [place] N, PA-C 12/11/19 1607    Melene Plan, DO 12/13/19 1502

## 2019-12-11 NOTE — ED Triage Notes (Signed)
Pt c/o left side CP and "everything on this side feels kinda sore"-including left UE and left LE-sx started after a stressful event last night ~7pm-NAD-steady gait

## 2021-03-15 IMAGING — CR DG CHEST 2V
2 series · 2 of 2 positions shown · non-contrast
Comparison: 11/17/2011

CLINICAL DATA: Chest pain and fluttering.

EXAM:
CHEST - 2 VIEW

[w chest pa]
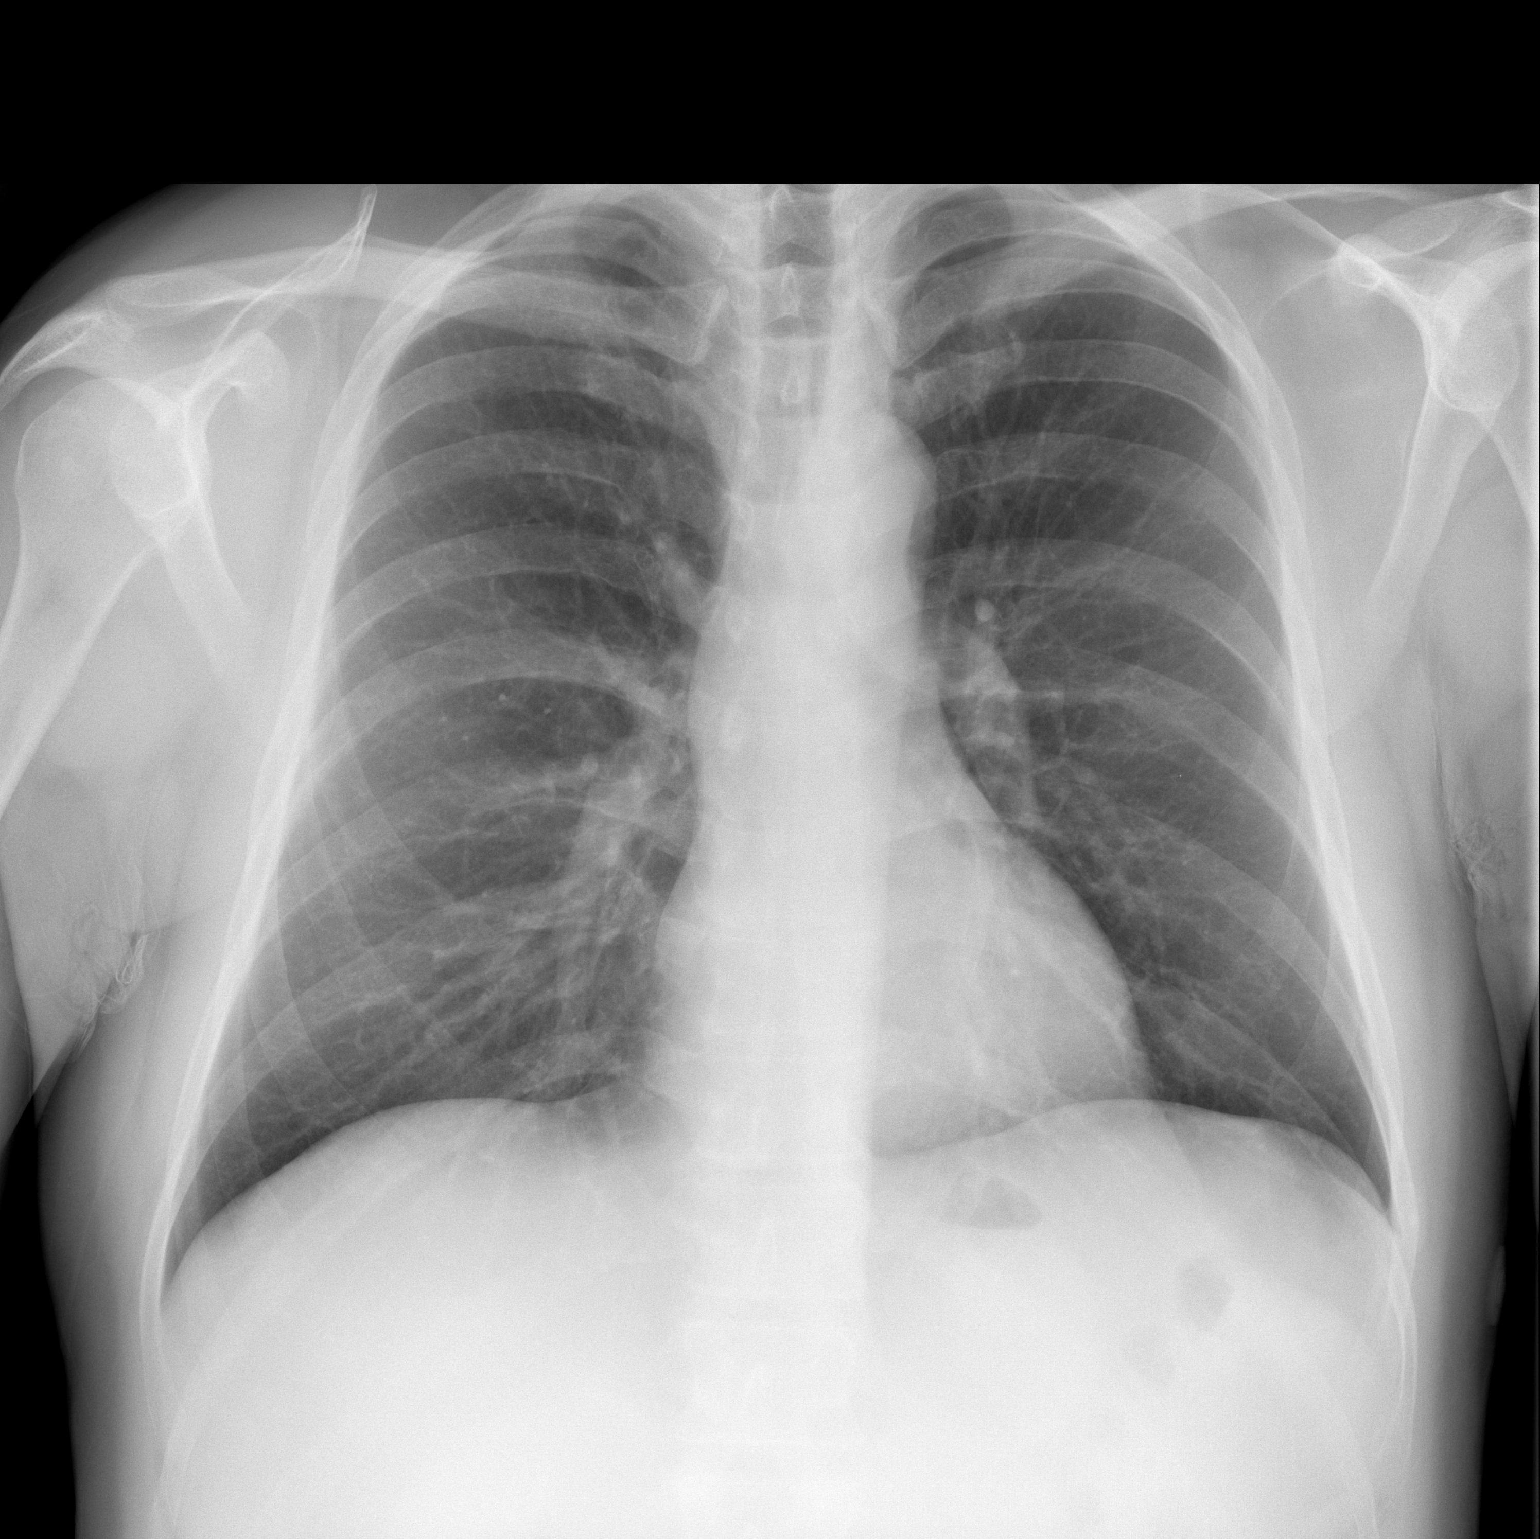

[w chest lat]
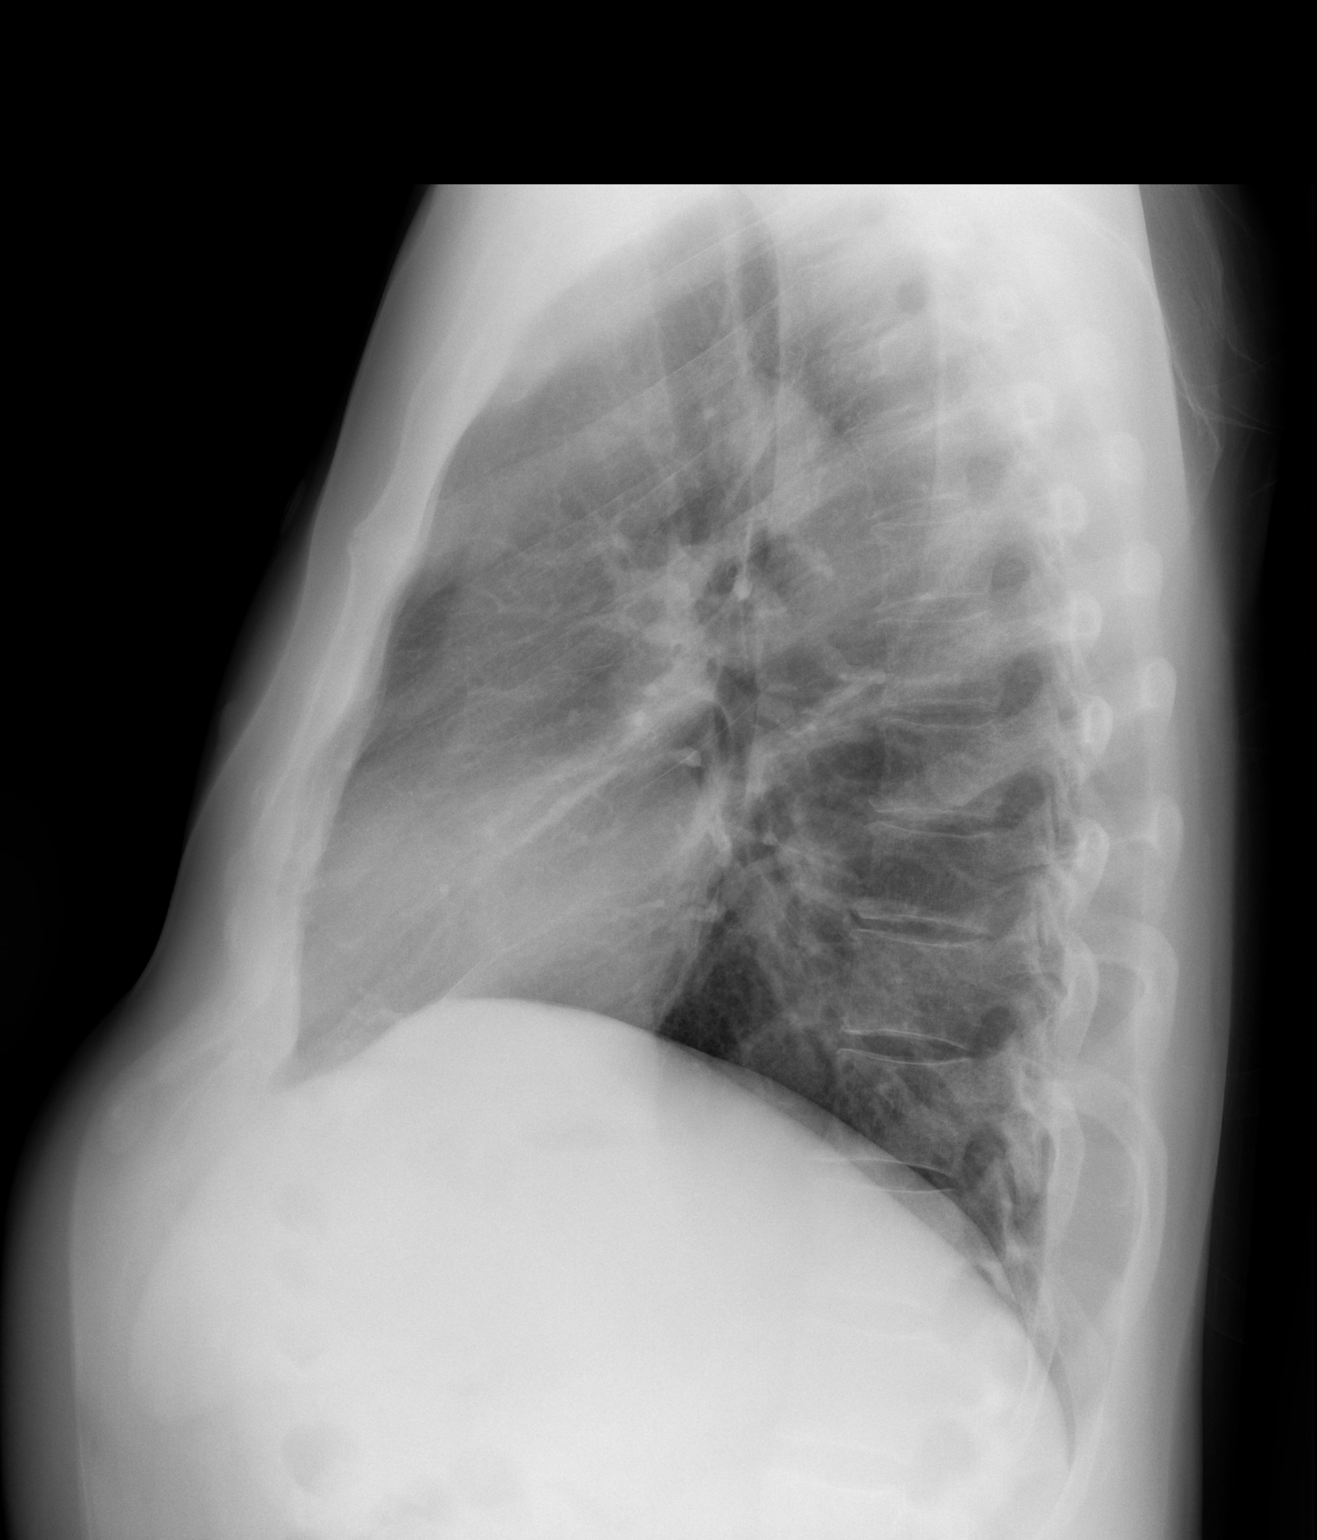

[2 of 2 positions shown; findings below may reference images not displayed]

FINDINGS: Heart size is normal. Mediastinal shadows are normal. The lungs are
clear. No bronchial thickening. No infiltrate, mass, effusion or
collapse. Pulmonary vascularity is normal. No bony abnormality.
IMPRESSION: Normal chest.

## 2023-02-23 NOTE — Progress Notes (Signed)
Surgical Instructions   Your procedure is scheduled on Thursday, November 21st, 2024. Report to [facility] Main Entrance "A" at 1:00 P.M., then check in with the Admitting office. Any questions or running late day of surgery: call [phone]  Questions prior to your surgery date: call [phone], Monday-Friday, 8am-4pm. If you experience any cold or flu symptoms such as cough, fever, chills, shortness of breath, etc. between now and your scheduled surgery, please notify us at the above number.     Remember:  Do not eat after midnight the night before your surgery   You may drink clear liquids until 12:00 PM the day of your surgery.   Clear liquids allowed are: Water, Non-Citrus Juices (without pulp), Carbonated Beverages, Clear Tea (no milk, honey, etc.), Black Coffee Only (NO MILK, CREAM OR POWDERED CREAMER of any kind), and Gatorade.    Take these medicines the morning of surgery with A SIP OF WATER : Lexapro   May take these medicines IF NEEDED: None    One week prior to surgery, STOP taking any Aspirin (unless otherwise instructed by your surgeon) Aleve, Naproxen, Ibuprofen, Motrin, Advil, Goody's, BC's, all herbal medications, fish oil, and non-prescription vitamins.                     Do NOT Smoke (Tobacco/Vaping) for 24 hours prior to your procedure.  If you use a CPAP at night, you may bring your mask/headgear for your overnight stay.   You will be asked to remove any contacts, glasses, piercing's, hearing aid's, dentures/partials prior to surgery. Please bring cases for these items if needed.    Patients discharged the day of surgery will not be allowed to drive home, and someone needs to stay with them for 24 hours.  SURGICAL WAITING ROOM VISITATION Patients may have no more than 2 support people in the waiting area - these visitors may rotate.   Pre-op nurse will coordinate an appropriate time for 1 ADULT support person, who may not rotate, to accompany patient  in pre-op.  Children under the age of 40 must have an adult with them who is not the patient and must remain in the main waiting area with an adult.  If the patient needs to stay at the hospital during part of their recovery, the visitor guidelines for inpatient rooms apply.  Please refer to the [facility] website for the visitor guidelines for any additional information.   If you received a COVID test during your pre-op visit  it is requested that you wear a mask when out in public, stay away from anyone that may not be feeling well and notify your surgeon if you develop symptoms. If you have been in contact with anyone that has tested positive in the last 10 days please notify you surgeon.      Pre-operative CHG Bathing Instructions   You can play a key role in reducing the risk of infection after surgery. Your skin needs to be as free of germs as possible. You can reduce the number of germs on your skin by washing with CHG (chlorhexidine gluconate) soap before surgery. CHG is an antiseptic soap that kills germs and continues to kill germs even after washing.   DO NOT use if you have an allergy to chlorhexidine/CHG or antibacterial soaps. If your skin becomes reddened or irritated, stop using the CHG and notify one of our RNs at [phone].  TAKE A SHOWER THE NIGHT BEFORE SURGERY AND THE DAY OF SURGERY    Please keep in mind the following:  DO NOT shave, including legs and underarms, 48 hours prior to surgery.   You may shave your face before/day of surgery.  Place clean sheets on your bed the night before surgery Use a clean washcloth (not used since being washed) for each shower. DO NOT sleep with pet's night before surgery.  CHG Shower Instructions:  Wash your face and private area with normal soap. If you choose to wash your hair, wash first with your normal shampoo.  After you use shampoo/soap, rinse your hair and body thoroughly to remove shampoo/soap residue.   Turn the water OFF and apply half the bottle of CHG soap to a CLEAN washcloth.  Apply CHG soap ONLY FROM YOUR NECK DOWN TO YOUR TOES (washing for 3-5 minutes)  DO NOT use CHG soap on face, private areas, open wounds, or sores.  Pay special attention to the area where your surgery is being performed.  If you are having back surgery, having someone wash your back for you may be helpful. Wait 2 minutes after CHG soap is applied, then you may rinse off the CHG soap.  Pat dry with a clean towel  Put on clean pajamas    Additional instructions for the day of surgery: DO NOT APPLY any lotions, deodorants, cologne, or perfumes.   Do not wear jewelry or makeup Do not wear nail polish, gel polish, artificial nails, or any other type of covering on natural nails (fingers and toes) Do not bring valuables to the hospital. [facility] is not responsible for valuables/personal belongings. Put on clean/comfortable clothes.  Please brush your teeth.  Ask your nurse before applying any prescription medications to the skin.

## 2023-02-24 ENCOUNTER — Other Ambulatory Visit: Payer: Self-pay

## 2023-02-24 ENCOUNTER — Encounter (HOSPITAL_COMMUNITY): Payer: Self-pay

## 2023-02-24 ENCOUNTER — Encounter (HOSPITAL_COMMUNITY)
Admission: RE | Admit: 2023-02-24 | Discharge: 2023-02-24 | Disposition: A | Discharge status: Home / Self Care | Payer: Self-pay | Source: Ambulatory Visit | Attending: Surgery | Admitting: Surgery

## 2023-02-24 VITALS — BP 143/92 | HR 77 | Temp 98.5°F | Resp 20 | Ht 71.0 in | Wt 204.7 lb

## 2023-02-24 DIAGNOSIS — Z01812 Encounter for preprocedural laboratory examination: Secondary | ICD-10-CM | POA: Insufficient documentation

## 2023-02-24 DIAGNOSIS — Z01818 Encounter for other preprocedural examination: Secondary | ICD-10-CM

## 2023-02-24 HISTORY — DX: Anxiety disorder, unspecified: F41.9

## 2023-02-24 LAB — CBC
HCT: 48.2 % (ref 39.0–52.0)
Hemoglobin: 16.4 g/dL (ref 13.0–17.0)
MCH: 28.4 pg (ref 26.0–34.0)
MCHC: 34 g/dL (ref 30.0–36.0)
MCV: 83.5 fL (ref 80.0–100.0)
Platelets: 261 10*3/uL (ref 150–400)
RBC: 5.77 MIL/uL (ref 4.22–5.81)
RDW: 12.7 % (ref 11.5–15.5)
WBC: 6.4 10*3/uL (ref 4.0–10.5)
nRBC: 0 % (ref 0.0–0.2)

## 2023-02-24 LAB — BASIC METABOLIC PANEL
Anion gap: 8 (ref 5–15)
BUN: 21 mg/dL — ABNORMAL HIGH (ref 6–20)
CO2: 23 mmol/L (ref 22–32)
Calcium: 9.3 mg/dL (ref 8.9–10.3)
Chloride: 105 mmol/L (ref 98–111)
Creatinine, Ser: 1 mg/dL (ref 0.61–1.24)
GFR, Estimated: >60 mL/min (ref >60)
Glucose, Bld: 93 mg/dL (ref 70–99)
Potassium: 3.7 mmol/L (ref 3.5–5.1)
Sodium: 136 mmol/L (ref 135–145)

## 2023-02-24 NOTE — Progress Notes (Signed)
PCP - Dr. Avelino Leeds, [facility] Cardiologist - DENIES  PPM/ICD - DENIES Device Orders - N/A Rep Notified - N/A  Chest x-ray - DENIES EKG - 12/12/19 - N/A Stress Test - DENIES ECHO - DENIES Cardiac Cath - DENIES  Sleep Study - DENIES CPAP - N/A  Fasting Blood Sugar - DENIES Checks Blood Sugar _____ times a day N/A  Last dose of GLP1 agonist-  N/A GLP1 instructions: N/A  Blood Thinner Instructions: FOLLOW SURGEON'S INSTRUCTIONS,  Aspirin Instructions: Does not take, but follow instructions on the handout  ERAS Protcol - Can have clears until 12:00;  PRE-SURGERY Ensure or G2- none  COVID TEST- n/a   Anesthesia review: N  Patient denies shortness of breath, fever, cough and chest pain at PAT appointment. Patient denies any respiratory illnesses at this time.    All instructions explained to the patient, with a verbal understanding of the material. Patient agrees to go over the instructions while at home for a better understanding. Patient also instructed to self quarantine after being tested for COVID-19. The opportunity to ask questions was provided.

## 2023-03-02 ENCOUNTER — Ambulatory Visit (HOSPITAL_BASED_OUTPATIENT_CLINIC_OR_DEPARTMENT_OTHER): Payer: Self-pay | Admitting: Certified Registered Nurse Anesthetist

## 2023-03-02 ENCOUNTER — Other Ambulatory Visit: Payer: Self-pay

## 2023-03-02 ENCOUNTER — Encounter (HOSPITAL_COMMUNITY)
Admission: RE | Disposition: A | Discharge status: Home / Self Care | Payer: Self-pay | Source: Home / Self Care | Attending: Surgery

## 2023-03-02 ENCOUNTER — Ambulatory Visit (HOSPITAL_COMMUNITY): Payer: Self-pay | Admitting: Certified Registered Nurse Anesthetist

## 2023-03-02 ENCOUNTER — Encounter (HOSPITAL_COMMUNITY): Payer: Self-pay | Admitting: Surgery

## 2023-03-02 ENCOUNTER — Ambulatory Visit (HOSPITAL_COMMUNITY)
Admission: RE | Admit: 2023-03-02 | Discharge: 2023-03-02 | Disposition: A | Discharge status: Home / Self Care | Payer: Self-pay | Attending: Surgery | Admitting: Surgery

## 2023-03-02 DIAGNOSIS — F419 Anxiety disorder, unspecified: Secondary | ICD-10-CM | POA: Insufficient documentation

## 2023-03-02 DIAGNOSIS — D171 Benign lipomatous neoplasm of skin and subcutaneous tissue of trunk: Secondary | ICD-10-CM | POA: Insufficient documentation

## 2023-03-02 HISTORY — PX: LIPOMA EXCISION: SHX5283

## 2023-03-02 SURGERY — EXCISION LIPOMA
Anesthesia: General | Site: Back | Laterality: Right

## 2023-03-02 MED ORDER — EPHEDRINE SULFATE (PRESSORS) 50 MG/ML IJ SOLN
INTRAMUSCULAR | Status: DC | PRN
  Administered 2023-03-02: 5 mg via INTRAVENOUS

## 2023-03-02 MED ORDER — BUPIVACAINE HCL (PF) 0.25 % IJ SOLN
INTRAMUSCULAR | Status: AC
  Filled 2023-03-02: qty 30

## 2023-03-02 MED ORDER — PROPOFOL 10 MG/ML IV BOLUS
INTRAVENOUS | Status: DC | PRN
  Administered 2023-03-02: 200 mg via INTRAVENOUS

## 2023-03-02 MED ORDER — LIDOCAINE 2% (20 MG/ML) 5 ML SYRINGE
INTRAMUSCULAR | Status: AC
  Filled 2023-03-02: qty 5

## 2023-03-02 MED ORDER — ACETAMINOPHEN 160 MG/5ML PO SOLN: 1000.0000 mg | Freq: Once | ORAL | Status: DC | PRN

## 2023-03-02 MED ORDER — 0.9 % SODIUM CHLORIDE (POUR BTL) OPTIME
TOPICAL | Status: DC | PRN
  Administered 2023-03-02: 1000 mL

## 2023-03-02 MED ORDER — FENTANYL CITRATE (PF) 250 MCG/5ML IJ SOLN
INTRAMUSCULAR | Status: AC
  Filled 2023-03-02: qty 5

## 2023-03-02 MED ORDER — FENTANYL CITRATE (PF) 250 MCG/5ML IJ SOLN
INTRAMUSCULAR | Status: DC | PRN
  Administered 2023-03-02 (×2): 50 ug via INTRAVENOUS

## 2023-03-02 MED ORDER — KETOROLAC TROMETHAMINE 30 MG/ML IJ SOLN
INTRAMUSCULAR | Status: AC
  Filled 2023-03-02: qty 1

## 2023-03-02 MED ORDER — CHLORHEXIDINE GLUCONATE CLOTH 2 % EX PADS: 6.0000 | MEDICATED_PAD | Freq: Once | CUTANEOUS | Status: DC

## 2023-03-02 MED ORDER — ONDANSETRON HCL 4 MG/2ML IJ SOLN
INTRAMUSCULAR | Status: AC
  Filled 2023-03-02: qty 2

## 2023-03-02 MED ORDER — OXYCODONE HCL 5 MG/5ML PO SOLN: 5.0000 mg | Freq: Once | ORAL | Status: DC | PRN

## 2023-03-02 MED ORDER — LACTATED RINGERS IV SOLN: INTRAVENOUS | Status: DC

## 2023-03-02 MED ORDER — DEXAMETHASONE SODIUM PHOSPHATE 10 MG/ML IJ SOLN
INTRAMUSCULAR | Status: AC
  Filled 2023-03-02: qty 1

## 2023-03-02 MED ORDER — BUPIVACAINE-EPINEPHRINE (PF) 0.25% -1:200000 IJ SOLN
INTRAMUSCULAR | Status: AC
  Filled 2023-03-02: qty 30

## 2023-03-02 MED ORDER — FENTANYL CITRATE (PF) 100 MCG/2ML IJ SOLN: 25.0000 ug | INTRAMUSCULAR | Status: DC | PRN

## 2023-03-02 MED ORDER — LIDOCAINE 2% (20 MG/ML) 5 ML SYRINGE
INTRAMUSCULAR | Status: DC | PRN
  Administered 2023-03-02: 60 mg via INTRAVENOUS

## 2023-03-02 MED ORDER — OXYCODONE HCL 5 MG PO TABS: 5.0000 mg | ORAL_TABLET | Freq: Once | ORAL | Status: DC | PRN

## 2023-03-02 MED ORDER — MIDAZOLAM HCL 2 MG/2ML IJ SOLN
INTRAMUSCULAR | Status: DC | PRN
  Administered 2023-03-02: 2 mg via INTRAVENOUS

## 2023-03-02 MED ORDER — DEXAMETHASONE SODIUM PHOSPHATE 10 MG/ML IJ SOLN
INTRAMUSCULAR | Status: DC | PRN
  Administered 2023-03-02: 10 mg via INTRAVENOUS

## 2023-03-02 MED ORDER — ACETAMINOPHEN 10 MG/ML IV SOLN: 1000.0000 mg | Freq: Once | INTRAVENOUS | Status: DC | PRN

## 2023-03-02 MED ORDER — PROPOFOL 10 MG/ML IV BOLUS
INTRAVENOUS | Status: AC
  Filled 2023-03-02: qty 20

## 2023-03-02 MED ORDER — OXYCODONE HCL 5 MG PO TABS: 5.0000 mg | ORAL_TABLET | Freq: Four times a day (QID) | ORAL | 0 refills | Status: AC | PRN

## 2023-03-02 MED ORDER — CEFAZOLIN SODIUM 1 G IJ SOLR
INTRAMUSCULAR | Status: AC
  Filled 2023-03-02: qty 10

## 2023-03-02 MED ORDER — KETOROLAC TROMETHAMINE 30 MG/ML IJ SOLN
INTRAMUSCULAR | Status: DC | PRN
  Administered 2023-03-02: 15 mg via INTRAVENOUS

## 2023-03-02 MED ORDER — ONDANSETRON HCL 4 MG/2ML IJ SOLN
INTRAMUSCULAR | Status: DC | PRN
  Administered 2023-03-02: 4 mg via INTRAVENOUS

## 2023-03-02 MED ORDER — CEFAZOLIN SODIUM-DEXTROSE 2-4 GM/100ML-% IV SOLN
2.0000 g | INTRAVENOUS | Status: AC
  Administered 2023-03-02: 2 g via INTRAVENOUS

## 2023-03-02 MED ORDER — ACETAMINOPHEN 500 MG PO TABS: 1000.0000 mg | ORAL_TABLET | Freq: Once | ORAL | Status: DC | PRN

## 2023-03-02 MED ORDER — BUPIVACAINE-EPINEPHRINE 0.25% -1:200000 IJ SOLN
INTRAMUSCULAR | Status: DC | PRN
Start: 2023-03-02 — End: 2023-03-02
  Administered 2023-03-02: 17 mL

## 2023-03-02 MED ORDER — ORAL CARE MOUTH RINSE: 15.0000 mL | Freq: Once | OROMUCOSAL | Status: AC

## 2023-03-02 MED ORDER — CHLORHEXIDINE GLUCONATE 0.12 % MT SOLN
OROMUCOSAL | Status: AC
  Administered 2023-03-02: 15 mL via OROMUCOSAL
  Filled 2023-03-02: qty 15

## 2023-03-02 MED ORDER — CHLORHEXIDINE GLUCONATE 0.12 % MT SOLN: 15.0000 mL | Freq: Once | OROMUCOSAL | Status: AC

## 2023-03-02 MED ORDER — MIDAZOLAM HCL 2 MG/2ML IJ SOLN
INTRAMUSCULAR | Status: AC
  Filled 2023-03-02: qty 2

## 2023-03-02 SURGICAL SUPPLY — 32 items
BAG COUNTER SPONGE SURGICOUNT (BAG) ×2 IMPLANT
BLADE CLIPPER SURG (BLADE) IMPLANT
CHLORAPREP W/TINT 26 (MISCELLANEOUS) ×2 IMPLANT
COVER SURGICAL LIGHT HANDLE (MISCELLANEOUS) ×2 IMPLANT
DERMABOND ADVANCED .7 DNX12 (GAUZE/BANDAGES/DRESSINGS) ×2 IMPLANT
DRAPE LAPAROSCOPIC ABDOMINAL (DRAPES) IMPLANT
DRAPE LAPAROTOMY 100X72 PEDS (DRAPES) IMPLANT
DRSG TEGADERM 4X4.75 (GAUZE/BANDAGES/DRESSINGS) IMPLANT
ELECT REM PT RETURN 9FT ADLT (ELECTROSURGICAL) ×1
ELECTRODE REM PT RTRN 9FT ADLT (ELECTROSURGICAL) ×2 IMPLANT
GAUZE SPONGE 4X4 12PLY STRL (GAUZE/BANDAGES/DRESSINGS) IMPLANT
GLOVE BIO SURGEON STRL SZ8 (GLOVE) ×2 IMPLANT
GLOVE BIOGEL PI IND STRL 8 (GLOVE) ×2 IMPLANT
GOWN STRL REUS W/ TWL LRG LVL3 (GOWN DISPOSABLE) ×4 IMPLANT
GOWN STRL REUS W/ TWL XL LVL3 (GOWN DISPOSABLE) ×2 IMPLANT
KIT BASIN OR (CUSTOM PROCEDURE TRAY) ×2 IMPLANT
KIT TURNOVER KIT B (KITS) ×2 IMPLANT
NDL HYPO 25GX1X1/2 BEV (NEEDLE) ×2 IMPLANT
NEEDLE HYPO 25GX1X1/2 BEV (NEEDLE) ×1
NS IRRIG 1000ML POUR BTL (IV SOLUTION) ×2 IMPLANT
PACK GENERAL/GYN (CUSTOM PROCEDURE TRAY) ×2 IMPLANT
PAD ARMBOARD 7.5X6 YLW CONV (MISCELLANEOUS) ×2 IMPLANT
PENCIL SMOKE EVACUATOR (MISCELLANEOUS) ×2 IMPLANT
SPECIMEN JAR MEDIUM (MISCELLANEOUS) IMPLANT
SPIKE FLUID TRANSFER (MISCELLANEOUS) ×2 IMPLANT
SUT MNCRL AB 4-0 PS2 18 (SUTURE) ×2 IMPLANT
SUT VIC AB 2-0 SH 18 (SUTURE) IMPLANT
SUT VIC AB 2-0 SH 27X BRD (SUTURE) ×2 IMPLANT
SUT VIC AB 3-0 SH 27XBRD (SUTURE) ×2 IMPLANT
SYR CONTROL 10ML LL (SYRINGE) ×2 IMPLANT
TOWEL GREEN STERILE (TOWEL DISPOSABLE) ×2 IMPLANT
TOWEL GREEN STERILE FF (TOWEL DISPOSABLE) ×2 IMPLANT

## 2023-03-02 NOTE — Discharge Instructions (Signed)
#######################################################  GENERAL SURGERY: POST OP INSTRUCTIONS  ######################################################################  EAT Gradually transition to a high fiber diet with a fiber supplement over the next few weeks after discharge.  Start with a pureed / full liquid diet (see below)  WALK Walk an hour a day.  Control your pain to do that.    CONTROL PAIN Control pain so that you can walk, sleep, tolerate sneezing/coughing, go up/down stairs.  HAVE A BOWEL MOVEMENT DAILY Keep your bowels regular to avoid problems.  OK to try a laxative to override constipation.  OK to use an antidairrheal to slow down diarrhea.  Call if not better after 2 tries  CALL IF YOU HAVE PROBLEMS/CONCERNS Call if you are still struggling despite following these instructions. Call if you have concerns not answered by these instructions  ######################################################################    DIET: Follow a light bland diet & liquids the first 24 hours after arrival home, such as soup, liquids, starches, etc.  Be sure to drink plenty of fluids.  Quickly advance to a usual solid diet within a few days.  Avoid fast food or heavy meals as your are more likely to get nauseated or have irregular bowels.  A low-fat, high-fiber diet for the rest of your life is ideal.    Take your usually prescribed home medications unless otherwise directed. Blood thinners:  You can restart any strong blood thinners after the second postoperative day  for example: COUMADIN (warfarin), XERELTO (rivaroxaban), ELIQUIS (apixaban), PLAVIX (clopidigrel), BRILINTA (ticagrelor), EFFIENT (prasugrel), PRADAXA (dabigatran), etc  Continue aspirin before & after surgery..     Some oozing/bleeding the first 1-2 weeks is common but should taper down & be small volume.    If you are passing many large clots or having uncontrolling bleeding, call your surgeon  PAIN CONTROL: Pain  is best controlled by a usual combination of three different methods TOGETHER: Ice/Heat Over the counter pain medication Prescription pain medication Most patients will experience some swelling and bruising around the incisions.  Ice packs or heating pads (30-60 minutes up to 6 times a day) will help. Use ice for the first few days to help decrease swelling and bruising, then switch to heat to help relax tight/sore spots and speed recovery.  Some people prefer to use ice alone, heat alone, alternating between ice & heat.  Experiment to what works for you.  Swelling and bruising can take several weeks to resolve.   It is helpful to take an over-the-counter pain medication regularly for the first few weeks.  Choose one of the following that works best for you: Naproxen (Aleve, etc)  Two 220mg  tabs twice a day Ibuprofen (Advil, etc) Three 200mg  tabs four times a day (every meal & bedtime) Acetaminophen (Tylenol, etc) 500-650mg  four times a day (every meal & bedtime) A  prescription for pain medication (such as oxycodone, hydrocodone, etc) should be given to you upon discharge.  Take your pain medication as prescribed.  If you are having problems/concerns with the prescription medicine (does not control pain, nausea, vomiting, rash, itching, etc), please call us [phone] to see if we need to switch you to a different pain medicine that will work better for you and/or control your side effect better. If you need a refill on your pain medication, please contact your pharmacy.  They will contact our office to request authorization. Prescriptions will not be filled after 5 pm or on week-ends.  Avoid getting constipated.  Between the surgery and the pain medications, it is  common to experience some constipation.  Increasing fluid intake and taking a fiber supplement (such as Metamucil, Citrucel, FiberCon, MiraLax, etc) 1-2 times a day regularly will usually help prevent this problem from occurring.  A mild  laxative (prune juice, Milk of Magnesia, MiraLax, etc) should be taken according to package directions if there are no bowel movements after 48 hours.   Watch out for diarrhea.  If you have many loose bowel movements, simplify your diet to bland foods & liquids for a few days.  Stop any stool softeners and decrease your fiber supplement.  Switching to mild anti-diarrheal medications (Loperamide/Imodium, Kayopectate, Pepto Bismol) can help.  If this worsens or does not improve, please call us.  Wash / shower every day.  You may shower over the dressings as they are waterproof.  Continue to shower over incision(s) after the dressing is off. Remove your waterproof bandages 5 days after surgery.  You may leave the incision open to air.  You may have skin tapes (Steri Strips) covering the incision(s).  Leave them on until one week, then remove.  You may replace a dressing/Band-Aid to cover the incision for comfort if you wish.   ACTIVITIES as tolerated:   You may resume regular (light) daily activities beginning the next day--such as daily self-care, walking, climbing stairs--gradually increasing activities as tolerated.  If you can walk 30 minutes without difficulty, it is safe to try more intense activity such as jogging, treadmill, bicycling, low-impact aerobics, swimming, etc. Save the most intensive and strenuous activity for last such as sit-ups, heavy lifting, contact sports, etc  Refrain from any heavy lifting or straining until you are off narcotics for pain control.   DO NOT PUSH THROUGH PAIN.  Let pain be your guide: If it hurts to do something, don't do it.  Pain is your body warning you to avoid that activity for another week until the pain goes down. You may drive when you are no longer taking prescription pain medication, you can comfortably wear a seatbelt, and you can safely maneuver your car and apply brakes. You may have sexual intercourse when it is comfortable.   FOLLOW UP in our  office Please call [facility] at [phone] to set up an appointment to see your surgeon in the office for a follow-up appointment approximately 2-3 weeks after your surgery. Make sure that you call for this appointment the day you arrive home to insure a convenient appointment time.  9. IF YOU HAVE DISABILITY OR FAMILY LEAVE FORMS, BRING THEM TO THE OFFICE FOR PROCESSING.  DO NOT GIVE THEM TO YOUR DOCTOR.   WHEN TO CALL us [phone]: Poor pain control Reactions / problems with new medications (rash/itching, nausea, etc)  Fever over 101.5 F (38.5 C) Worsening swelling or bruising Continued bleeding from incision. Increased pain, redness, or drainage from the incision Difficulty breathing / swallowing   The clinic staff is available to answer your questions during regular business hours (8:30am-5pm).  Please don't hesitate to call and ask to speak to one of our nurses for clinical concerns.   If you have a medical emergency, go to the nearest emergency room or call 911.  A surgeon from [facility] is always on call at the [facility], [place] [address], [address], [place], [place]  [address] ? MAIN: [phone] ? TOLL FREE: [phone] ?  FAX [phone] [email or web address]  #######################################################    Remove dressing in 48 hours  Ok  to shower over it  Leave uncovered afterwards

## 2023-03-02 NOTE — Anesthesia Procedure Notes (Signed)
Procedure Name: LMA Insertion Date/Time: 03/02/2023 2:55 PM  Performed by: Alwyn Ren, CRNAPre-anesthesia Checklist: Patient identified, Timeout performed, Emergency Drugs available, Suction available and Patient being monitored Patient Re-evaluated:Patient Re-evaluated prior to induction Oxygen Delivery Method: Circle system utilized Preoxygenation: Pre-oxygenation with 100% oxygen Induction Type: IV induction LMA Size: 4.0 Number of attempts: 1

## 2023-03-02 NOTE — Op Note (Signed)
Preoperative diagnosis: Lipoma upper back subcutaneous 5 cm x 5 cm  Postoperative diagnosis: Same  Procedure: Excision of subcutaneous 5 cm x 5 cm lipoma upper back  Surgeon: Harriette Bouillon, MD  Anesthesia: General With 0.25% Marcaine with epinephrine  EBL: Minimal  Specimen: Lipoma to pathology  Drains: None  Indications for procedure: The patient is a 52 year old male with a slowly growing mass on his upper back consistent with lipoma.  He presents today for excision.The procedure has been discussed with the patient.  Alternative therapies have been discussed with the patient.  Operative risks include bleeding,  Infection,  Organ injury,  Nerve injury,  Blood vessel injury,  DVT,  Pulmonary embolism,  Death,  And possible reoperation.  Medical management risks include worsening of present situation.  The success of the procedure is 50 -90 % at treating patients symptoms.  The patient understands and agrees to proceed.     Description of procedure: The patient was met in the holding area and the areas marked with the assistance of the patient.  He was then taken back to the operating.  After induction of general anesthesia he was placed in the left side down and appropriately padded.  The upper back region was then prepped and draped in a sterile fashion.  Timeout performed.  Local anesthetic infiltrated of the mass.  Incision was made and dissection was carried down through the subcutaneous fatty tissues until the encapsulated lipoma was identified anterior to the trapezius muscle.  This dissected out easily.  Cautery was used for any small vessels for hemostasis.  This was passed off the field.  The deep tissue planes were approximated with 3-0 Vicryl after assuring hemostasis.  4 Monocryl was used to close the skin in a subcuticular fashion.  Dermabond applied.  Pressure dressing placed.  All counts found to be correct.  The patient was placed supine extubated taken to recovery in  satisfactory condition.

## 2023-03-02 NOTE — Anesthesia Preprocedure Evaluation (Signed)
Anesthesia Evaluation  Patient identified by MRN, date of birth, ID band Patient awake    Reviewed: Allergy & Precautions, H&P , NPO status , Patient's Chart, lab work & pertinent test results  Airway Mallampati: II  TM Distance: >3 FB Neck ROM: Full    Dental  (+) Teeth Intact, Dental Advisory Given   Pulmonary neg pulmonary ROS   breath sounds clear to auscultation       Cardiovascular negative cardio ROS  Rhythm:Regular     Neuro/Psych  PSYCHIATRIC DISORDERS Anxiety     negative neurological ROS     GI/Hepatic Neg liver ROS,GERD  Controlled,,  Endo/Other  negative endocrine ROS    Renal/GU negative Renal ROS     Musculoskeletal negative musculoskeletal ROS (+)    Abdominal   Peds  Hematology negative hematology ROS (+)   Anesthesia Other Findings Cleft palate repair  Reproductive/Obstetrics                             Anesthesia Physical Anesthesia Plan  ASA: 2  Anesthesia Plan: General   Post-op Pain Management: Ofirmev IV (intra-op)*   Induction: Intravenous  PONV Risk Score and Plan: 2 and Ondansetron and Dexamethasone  Airway Management Planned: LMA and Oral ETT  Additional Equipment: None  Intra-op Plan:   Post-operative Plan: Extubation in OR  Informed Consent: I have reviewed the patients History and Physical, chart, labs and discussed the procedure including the risks, benefits and alternatives for the proposed anesthesia with the patient or authorized representative who has indicated his/her understanding and acceptance.     Dental advisory given  Plan Discussed with: CRNA  Anesthesia Plan Comments:        Anesthesia Quick Evaluation

## 2023-03-02 NOTE — Transfer of Care (Signed)
Immediate Anesthesia Transfer of Care Note  Patient: Mark Berg  Procedure(s) Performed: EXCISION LIPOMA BACK (Right: Back)  Patient Location: PACU  Anesthesia Type:General  Level of Consciousness: sedated  Airway & Oxygen Therapy: Patient Spontanous Breathing and Patient connected to face mask oxygen  Post-op Assessment: Report given to RN and Post -op Vital signs reviewed and stable  Post vital signs: Reviewed and stable  Last Vitals:  Vitals Value Taken Time  BP 108/77 03/02/23 1542  Temp    Pulse 67 03/02/23 1544  Resp 14 03/02/23 1544  SpO2 95 % 03/02/23 1544  Vitals shown include unfiled device data.  Last Pain:  Vitals:   03/02/23 1339  TempSrc:   PainSc: 0-No pain         Complications: No notable events documented.

## 2023-03-02 NOTE — Interval H&P Note (Signed)
History and Physical Interval Note:  03/02/2023 2:23 PM  Mark Berg  has presented today for surgery, with the diagnosis of UPPER RIGHT BACK LIPOMA.  The various methods of treatment have been discussed with the patient and family. After consideration of risks, benefits and other options for treatment, the patient has consented to  Procedure(s): EXCISION LIPOMA BACK (N/A) as a surgical intervention.  The patient's history has been reviewed, patient examined, no change in status, stable for surgery.  I have reviewed the patient's chart and labs.  Questions were answered to the patient's satisfaction.     [name] A [name]

## 2023-03-02 NOTE — H&P (Signed)
History of Present Illness: Mark Berg is a 52 y.o. male who is seen today as an office consultation for evaluation of New Consultation (Lipoma of back)  Patient presents for evaluation of lipoma on his upper back. It has been present for 4 years. Causing mild discomfort especially when pressure is put on the. There is no redness, bleeding or ulceration. Denies any numbness or pain in the right upper extremity.  Review of Systems: A complete review of systems was obtained from the patient. I have reviewed this information and discussed as appropriate with the patient. See HPI as well for other ROS.    Medical History: Past Medical History:  Diagnosis Date  Anxiety  GERD (gastroesophageal reflux disease)   There is no problem list on file for this patient.  Past Surgical History:  Procedure Laterality Date  CLEFT PALATE REPAIR  EAR SURGERY  KNEE SURGERY    Not on File  Current Outpatient Medications on File Prior to Visit  Medication Sig Dispense Refill  escitalopram oxalate (LEXAPRO) 20 MG tablet Take 1 tablet by mouth once daily  magnesium glycinate 200 mg tablet Take by mouth 2 (two) times daily at lunch and dinner  multivitamin tablet Take 1 tablet by mouth once daily  omega-3 acid ethyl esters (LOVAZA) 1 gram capsule Take 2 g by mouth 2 (two) times daily   No current facility-administered medications on file prior to visit.   No family history on file.   Social History   Tobacco Use  Smoking Status Never  Smokeless Tobacco Never    Social History   Socioeconomic History  Marital status: Single  Tobacco Use  Smoking status: Never  Smokeless tobacco: Never  Vaping Use  Vaping status: Never Used  Substance and Sexual Activity  Alcohol use: Yes  Drug use: Never   Social Determinants of Health   Financial Resource Strain: Low Risk (09/19/2022)  Received from [facility]  Overall Financial Resource Strain (CARDIA)  Difficulty of Paying Living  Expenses: Not hard at all  Food Insecurity: No Food Insecurity (09/19/2022)  Received from [facility]  Hunger Vital Sign  Worried About Running Out of Food in the Last Year: Never true  Ran Out of Food in the Last Year: Never true  Transportation Needs: No Transportation Needs (09/19/2022)  Received from [facility] - Transportation  Lack of Transportation (Medical): No  Lack of Transportation (Non-Medical): No  Physical Activity: Insufficiently Active (06/28/2019)  Received from [facility]  Exercise Vital Sign  Days of Exercise per Week: 2 days  Minutes of Exercise per Session: 30 min  Stress: Stress Concern Present (06/28/2019)  Received from [facility] - Occupational Stress Questionnaire  Feeling of Stress : Rather much  Received from [employer]  Social Network   Objective:   Vitals:  01/10/23 0853  BP: 128/86  Pulse: 87  Temp: 36.9 C (98.4 F)  SpO2: 96%  Weight: 92.9 kg (204 lb 12.8 oz)  Height: 180.3 cm (5\' 11" )  PainSc: 0-No pain   Body mass index is 28.56 kg/m.  Physical Exam Cardiovascular:  Rate and Rhythm: Normal rate.  Pulmonary:  Effort: Pulmonary effort is normal.  Skin: General: Skin is warm.    Comments: 5 x 6 cm soft mobile mass over right upper back  Neurological:  General: No focal deficit present.  Mental Status: He is alert.  Psychiatric:  Mood and Affect: Mood normal.     Assessment and  Plan:   Diagnoses and all orders for this visit:  Lipoma of back   Patient desires excision of mass right upper back. This is clinically consistent with a lipoma. Risks and benefits of surgery reviewed. Physical bleeding, infection, wound complications, anesthesia recurrence, injury to neighboring structures, the need for other additional treatments. Observation reviewed.   Hayden Rasmussen, MD   I spent a total of 33 minutes in both face-to-face and non-face-to-face activities,  excluding procedures performed, for this visit on the date of this encounter.

## 2023-03-03 ENCOUNTER — Encounter (HOSPITAL_COMMUNITY): Payer: Self-pay | Admitting: Surgery

## 2023-03-03 NOTE — Anesthesia Postprocedure Evaluation (Signed)
Anesthesia Post Note  Patient: Mark Berg  Procedure(s) Performed: EXCISION LIPOMA BACK (Right: Back)     Patient location during evaluation: PACU Anesthesia Type: General Level of consciousness: awake and alert Pain management: pain level controlled Vital Signs Assessment: post-procedure vital signs reviewed and stable Respiratory status: spontaneous breathing, nonlabored ventilation and respiratory function stable Cardiovascular status: stable and blood pressure returned to baseline Anesthetic complications: no   No notable events documented.  Last Vitals:  Vitals:   03/02/23 1615 03/02/23 1630  BP: (!) 127/90 (!) 127/91  Pulse: 71 68  Resp: 17 14  Temp:  36.7 C  SpO2: 94% 92%    Last Pain:  Vitals:   03/02/23 1630  TempSrc:   PainSc: 0-No pain                 Beryle Lathe

## 2023-03-06 LAB — SURGICAL PATHOLOGY
